# Patient Record
Sex: Male | Born: 1975 | Race: Black or African American | Hispanic: No | Marital: Married | Smoking: Never smoker
Health system: Southern US, Community
[De-identification: ages and names within clinical notes are randomized; demographics above are authoritative.]

## PROBLEM LIST (undated history)

## (undated) DIAGNOSIS — I10 Essential (primary) hypertension: Secondary | ICD-10-CM

## (undated) DIAGNOSIS — E7800 Pure hypercholesterolemia, unspecified: Secondary | ICD-10-CM

## (undated) DIAGNOSIS — E119 Type 2 diabetes mellitus without complications: Secondary | ICD-10-CM

## (undated) HISTORY — PX: TONSILLECTOMY: SUR1361

## (undated) HISTORY — PX: MYRINGOTOMY: SUR874

---

## 2000-07-12 ENCOUNTER — Emergency Department (HOSPITAL_COMMUNITY): Admission: EM | Admit: 2000-07-12 | Discharge: 2000-07-12 | Payer: Self-pay | Admitting: Emergency Medicine

## 2000-07-12 ENCOUNTER — Encounter: Payer: Self-pay | Admitting: Emergency Medicine

## 2000-08-04 ENCOUNTER — Encounter: Admission: RE | Admit: 2000-08-04 | Discharge: 2000-10-29 | Payer: Self-pay | Admitting: Family Medicine

## 2006-05-08 ENCOUNTER — Emergency Department (HOSPITAL_COMMUNITY): Admission: EM | Admit: 2006-05-08 | Discharge: 2006-05-08 | Payer: Self-pay | Admitting: Emergency Medicine

## 2013-04-19 ENCOUNTER — Ambulatory Visit
Admission: RE | Admit: 2013-04-19 | Discharge: 2013-04-19 | Disposition: A | Discharge status: Home / Self Care | Payer: BC Managed Care – PPO | Source: Ambulatory Visit | Attending: Family Medicine | Admitting: Family Medicine

## 2013-04-19 ENCOUNTER — Other Ambulatory Visit: Payer: Self-pay | Admitting: Family Medicine

## 2013-04-19 DIAGNOSIS — R52 Pain, unspecified: Secondary | ICD-10-CM

## 2013-05-04 ENCOUNTER — Other Ambulatory Visit: Payer: Self-pay | Admitting: Orthopedic Surgery

## 2013-05-04 ENCOUNTER — Ambulatory Visit
Admission: RE | Admit: 2013-05-04 | Discharge: 2013-05-04 | Disposition: A | Discharge status: Home / Self Care | Payer: BC Managed Care – PPO | Source: Ambulatory Visit | Attending: Orthopedic Surgery | Admitting: Orthopedic Surgery

## 2013-05-04 DIAGNOSIS — G5791 Unspecified mononeuropathy of right lower limb: Secondary | ICD-10-CM

## 2013-05-11 ENCOUNTER — Ambulatory Visit: Payer: BC Managed Care – PPO

## 2013-11-04 IMAGING — CR DG LUMBAR SPINE COMPLETE 4+V
5 series · 5 of 5 positions shown · non-contrast
Comparison: None.

CLINICAL DATA: Right thigh pain, no trauma

LUMBAR SPINE - COMPLETE 4+ VIEW

[view not recorded (1 of 5)]
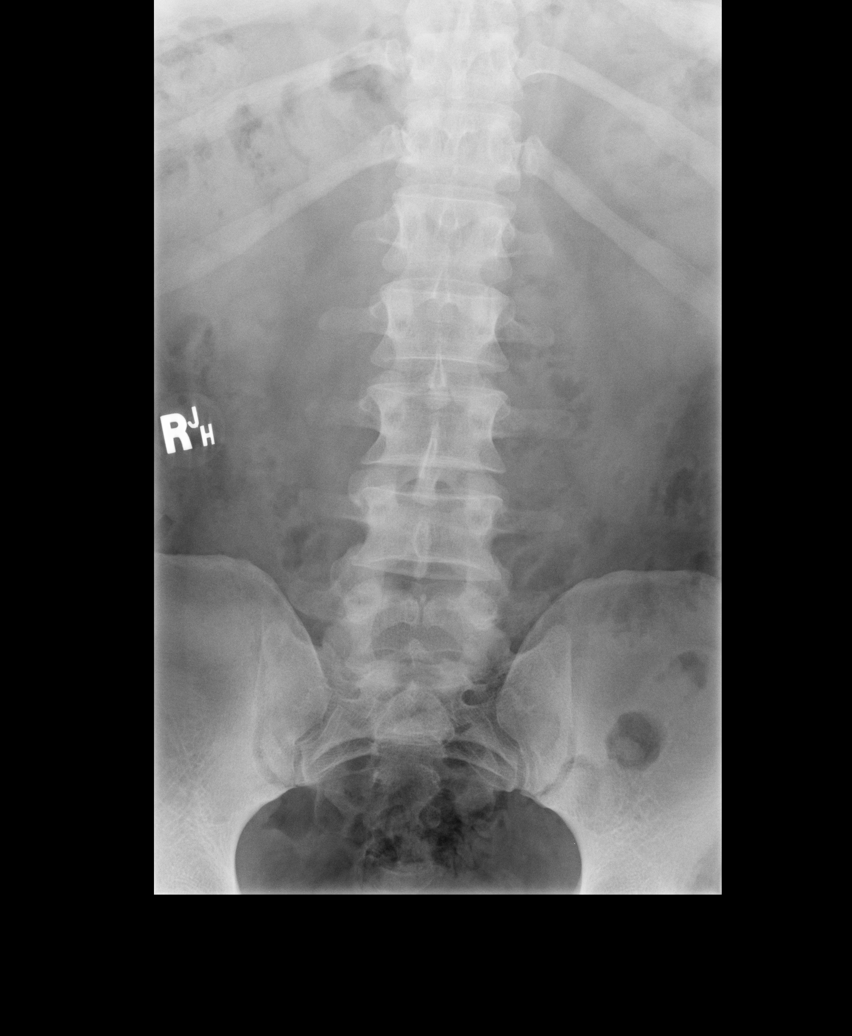

[view not recorded (2 of 5)]
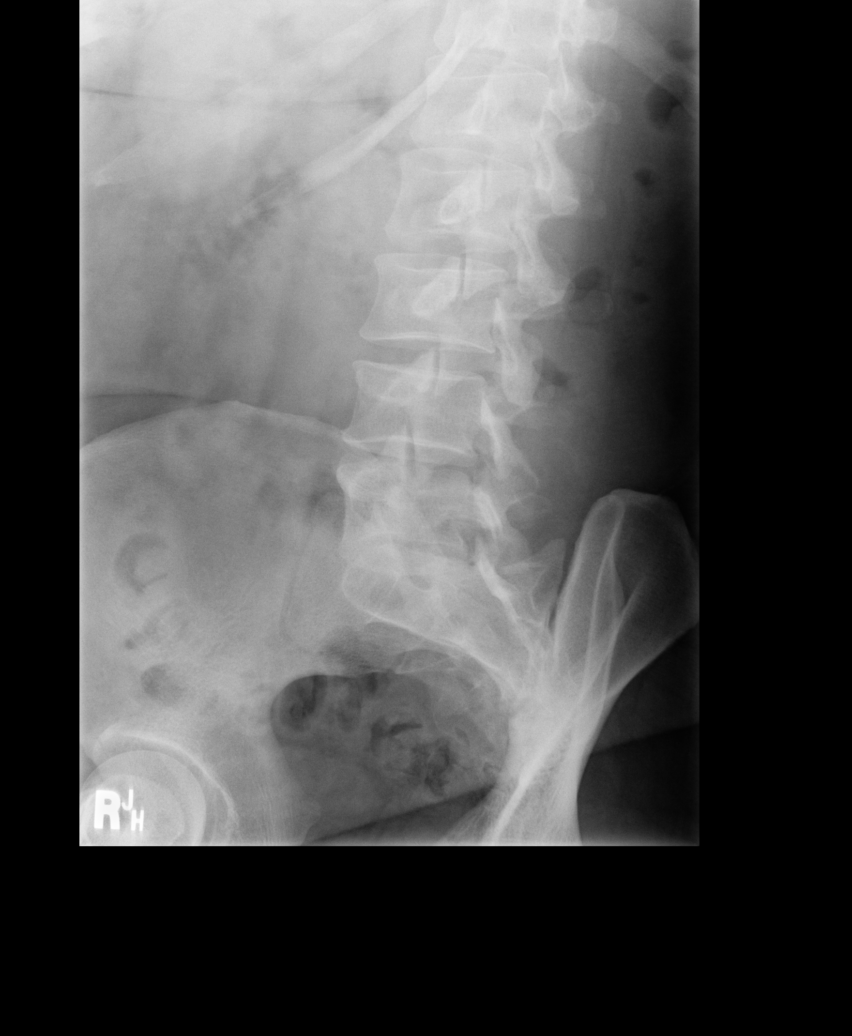

[view not recorded (3 of 5)]
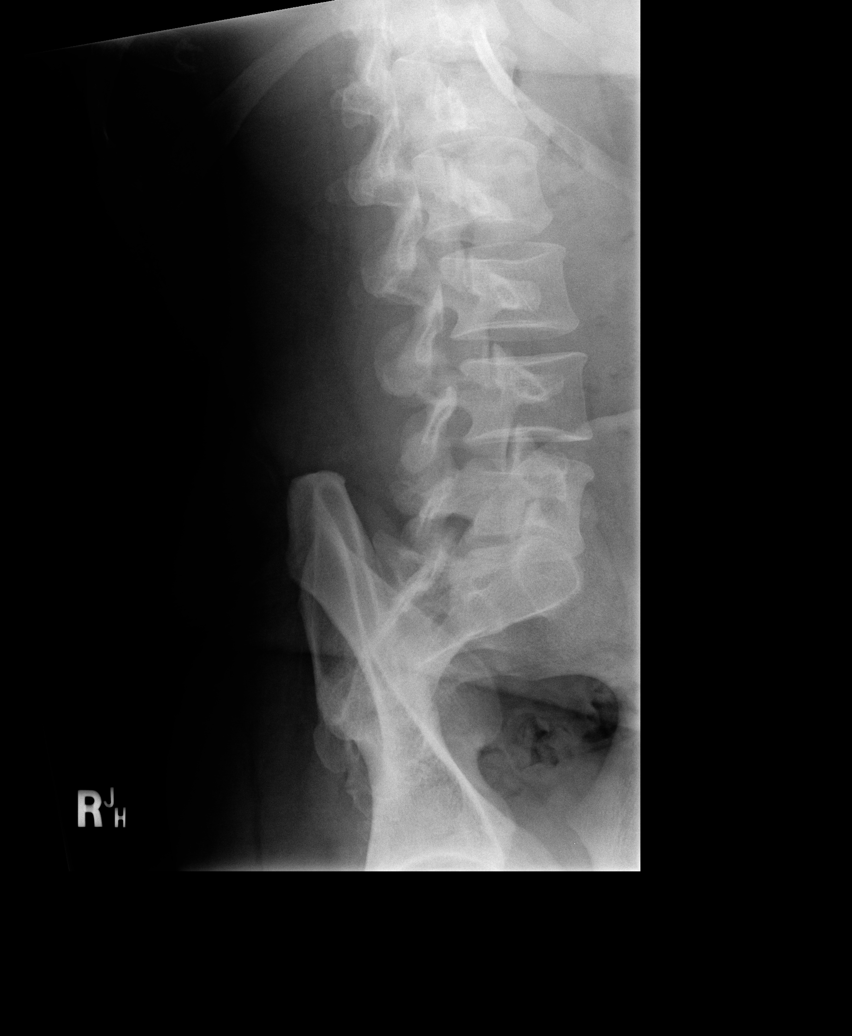

[view not recorded (4 of 5)]
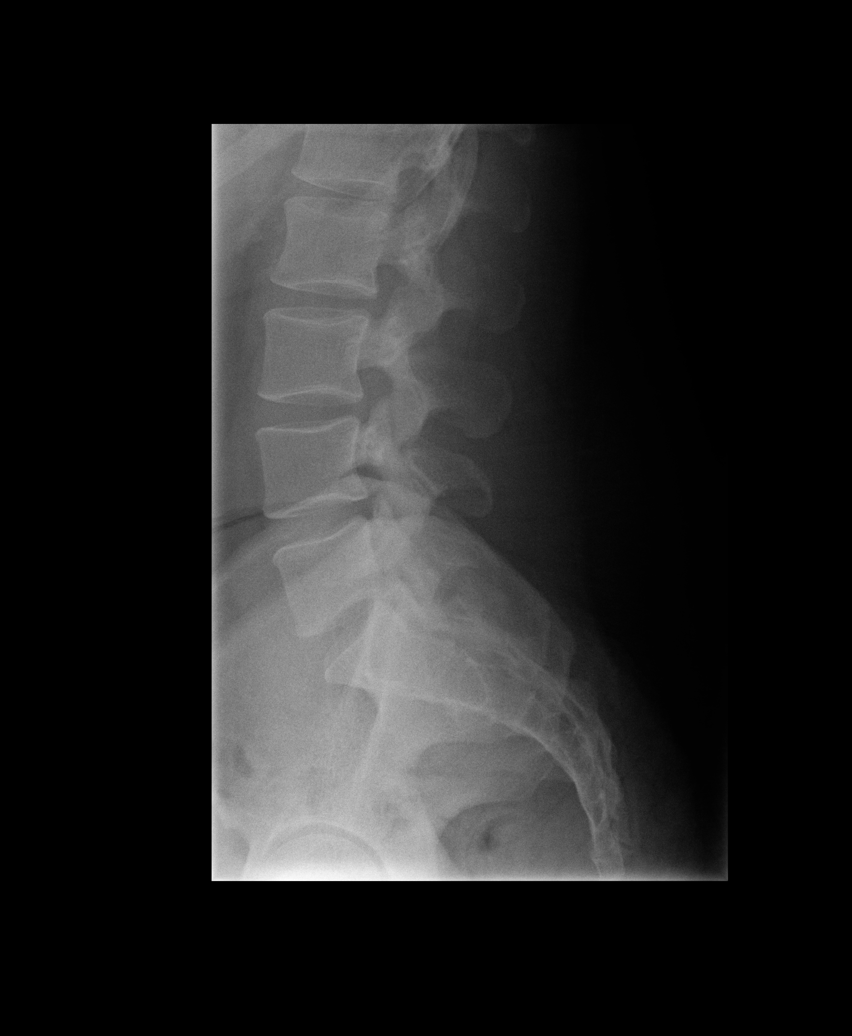

[view not recorded (5 of 5)]
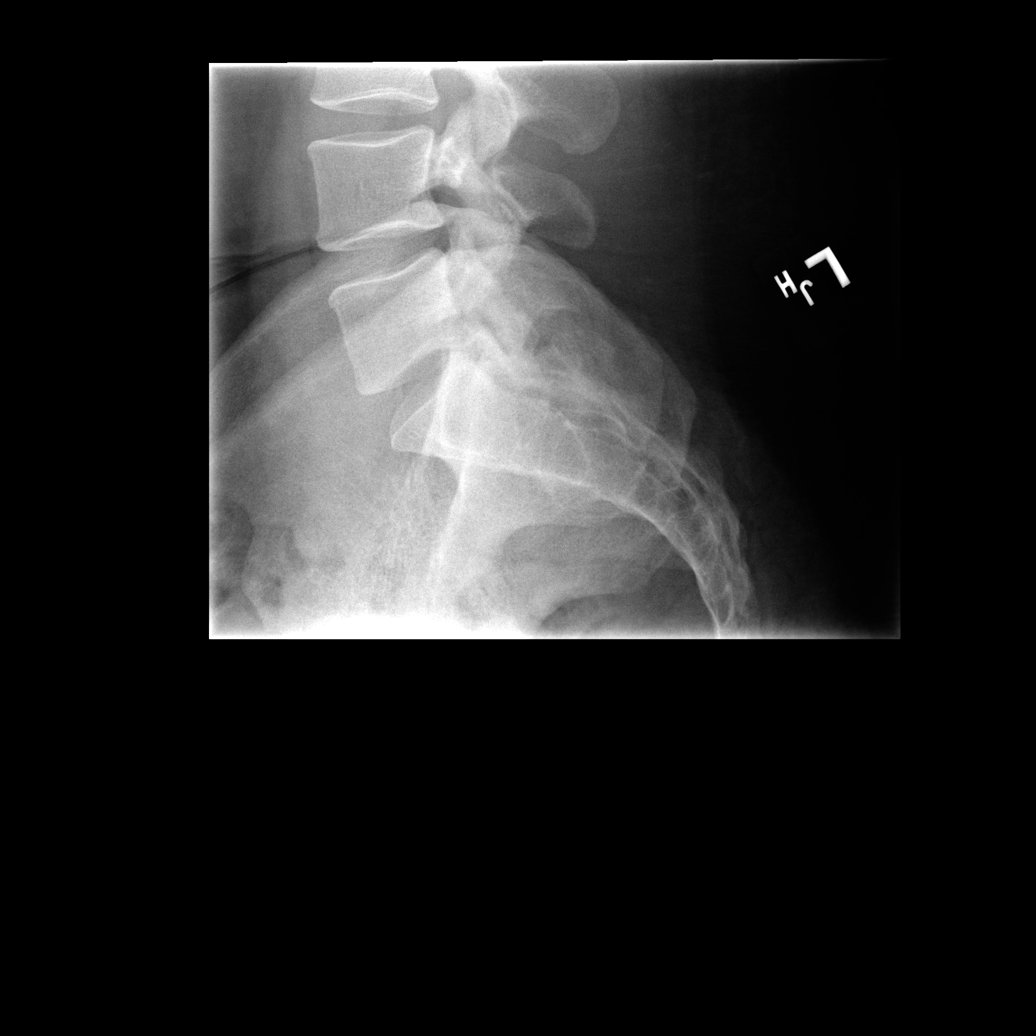

[5 of 5 positions shown; findings below may reference images not displayed]

FINDINGS: The lumbar vertebrae are in normal alignment.
Intervertebral disc spaces appear normal.  No acute compression
deformity is seen.  No pars defect is noted.  Spina bifida occulta
is present at L5.  The SI joints are corticated.
IMPRESSION: Normal alignment.  Normal intervertebral disc spaces.

## 2014-03-05 ENCOUNTER — Inpatient Hospital Stay (HOSPITAL_COMMUNITY)
Admission: EM | Admit: 2014-03-05 | Discharge: 2014-03-07 | DRG: 309 | Disposition: A | Discharge status: Home / Self Care | Payer: BC Managed Care – PPO | Attending: Internal Medicine | Admitting: Internal Medicine

## 2014-03-05 ENCOUNTER — Encounter (HOSPITAL_COMMUNITY): Payer: Self-pay | Admitting: Emergency Medicine

## 2014-03-05 ENCOUNTER — Emergency Department (HOSPITAL_COMMUNITY): Payer: BC Managed Care – PPO

## 2014-03-05 DIAGNOSIS — E785 Hyperlipidemia, unspecified: Secondary | ICD-10-CM | POA: Diagnosis present

## 2014-03-05 DIAGNOSIS — G4733 Obstructive sleep apnea (adult) (pediatric): Secondary | ICD-10-CM | POA: Diagnosis present

## 2014-03-05 DIAGNOSIS — I10 Essential (primary) hypertension: Secondary | ICD-10-CM | POA: Diagnosis present

## 2014-03-05 DIAGNOSIS — R079 Chest pain, unspecified: Secondary | ICD-10-CM

## 2014-03-05 DIAGNOSIS — Z6841 Body Mass Index (BMI) 40.0 and over, adult: Secondary | ICD-10-CM

## 2014-03-05 DIAGNOSIS — I4891 Unspecified atrial fibrillation: Principal | ICD-10-CM | POA: Diagnosis present

## 2014-03-05 DIAGNOSIS — E119 Type 2 diabetes mellitus without complications: Secondary | ICD-10-CM | POA: Diagnosis present

## 2014-03-05 HISTORY — DX: Type 2 diabetes mellitus without complications: E11.9

## 2014-03-05 HISTORY — DX: Essential (primary) hypertension: I10

## 2014-03-05 HISTORY — DX: Pure hypercholesterolemia, unspecified: E78.00

## 2014-03-05 LAB — URINALYSIS, ROUTINE W REFLEX MICROSCOPIC
BILIRUBIN URINE: NEGATIVE
Glucose, UA: NEGATIVE mg/dL
Hgb urine dipstick: NEGATIVE
Ketones, ur: NEGATIVE mg/dL
Leukocytes, UA: NEGATIVE
Nitrite: NEGATIVE
Protein, ur: NEGATIVE mg/dL
SPECIFIC GRAVITY, URINE: 1.028 (ref 1.005–1.030)
Urobilinogen, UA: 1 mg/dL (ref 0.0–1.0)
pH: 5.5 (ref 5.0–8.0)

## 2014-03-05 LAB — CBC WITH DIFFERENTIAL/PLATELET
Basophils Absolute: 0 10*3/uL (ref 0.0–0.1)
Basophils Relative: 0 % (ref 0–1)
EOS PCT: 2 % (ref 0–5)
Eosinophils Absolute: 0.3 10*3/uL (ref 0.0–0.7)
HCT: 49.3 % (ref 39.0–52.0)
HEMOGLOBIN: 17 g/dL (ref 13.0–17.0)
Lymphocytes Relative: 24 % (ref 12–46)
Lymphs Abs: 3.4 10*3/uL (ref 0.7–4.0)
MCH: 31.6 pg (ref 26.0–34.0)
MCHC: 34.5 g/dL (ref 30.0–36.0)
MCV: 91.6 fL (ref 78.0–100.0)
Monocytes Absolute: 1.2 10*3/uL — ABNORMAL HIGH (ref 0.1–1.0)
Monocytes Relative: 8 % (ref 3–12)
NEUTROS ABS: 9.3 10*3/uL — AB (ref 1.7–7.7)
NEUTROS PCT: 66 % (ref 43–77)
PLATELETS: 251 10*3/uL (ref 150–400)
RBC: 5.38 MIL/uL (ref 4.22–5.81)
RDW: 13.3 % (ref 11.5–15.5)
WBC: 14.3 10*3/uL — ABNORMAL HIGH (ref 4.0–10.5)

## 2014-03-05 LAB — LIPID PANEL
Cholesterol: 182 mg/dL (ref 0–200)
HDL: 40 mg/dL (ref >39)
LDL CALC: 125 mg/dL — AB (ref 0–99)
TRIGLYCERIDES: 84 mg/dL (ref <150)
Total CHOL/HDL Ratio: 4.6 RATIO
VLDL: 17 mg/dL (ref 0–40)

## 2014-03-05 LAB — COMPREHENSIVE METABOLIC PANEL
ALT: 24 U/L (ref 0–53)
AST: 25 U/L (ref 0–37)
Albumin: 3.8 g/dL (ref 3.5–5.2)
Alkaline Phosphatase: 65 U/L (ref 39–117)
BUN: 15 mg/dL (ref 6–23)
CO2: 25 meq/L (ref 19–32)
CREATININE: 1.01 mg/dL (ref 0.50–1.35)
Calcium: 9.5 mg/dL (ref 8.4–10.5)
Chloride: 100 mEq/L (ref 96–112)
GFR calc non Af Amer: >90 mL/min (ref >90)
GLUCOSE: 258 mg/dL — AB (ref 70–99)
Potassium: 5.1 mEq/L (ref 3.7–5.3)
SODIUM: 140 meq/L (ref 137–147)
Total Bilirubin: 0.3 mg/dL (ref 0.3–1.2)
Total Protein: 8 g/dL (ref 6.0–8.3)

## 2014-03-05 LAB — PROTIME-INR
INR: 1.03 (ref 0.00–1.49)
PROTHROMBIN TIME: 13.3 s (ref 11.6–15.2)

## 2014-03-05 LAB — GLUCOSE, CAPILLARY
GLUCOSE-CAPILLARY: 127 mg/dL — AB (ref 70–99)
Glucose-Capillary: 164 mg/dL — ABNORMAL HIGH (ref 70–99)
Glucose-Capillary: 144 mg/dL — ABNORMAL HIGH (ref 70–99)

## 2014-03-05 LAB — HEPARIN LEVEL (UNFRACTIONATED): Heparin Unfractionated: 0.23 IU/mL — ABNORMAL LOW (ref 0.30–0.70)

## 2014-03-05 LAB — APTT: aPTT: 28 s (ref 24–37)

## 2014-03-05 LAB — HEMOGLOBIN A1C
Hgb A1c MFr Bld: 7.2 % — ABNORMAL HIGH (ref <5.7)
Mean Plasma Glucose: 160 mg/dL — ABNORMAL HIGH (ref <117)

## 2014-03-05 LAB — TROPONIN I
Troponin I: <0.3 ng/mL (ref <0.30)
Troponin I: <0.3 ng/mL (ref <0.30)
Troponin I: <0.3 ng/mL (ref <0.30)

## 2014-03-05 LAB — MRSA PCR SCREENING: MRSA BY PCR: NEGATIVE

## 2014-03-05 LAB — TSH: TSH: 3.09 u[IU]/mL (ref 0.350–4.500)

## 2014-03-05 MED ORDER — DILTIAZEM HCL 100 MG IV SOLR
5.0000 mg/h | INTRAVENOUS | Status: DC
  Administered 2014-03-05 – 2014-03-06 (×2): 5 mg/h via INTRAVENOUS

## 2014-03-05 MED ORDER — INSULIN ASPART 100 UNIT/ML ~~LOC~~ SOLN
0.0000 [IU] | Freq: Three times a day (TID) | SUBCUTANEOUS | Status: DC
  Administered 2014-03-05: 3 [IU] via SUBCUTANEOUS
  Administered 2014-03-05 – 2014-03-06 (×4): 2 [IU] via SUBCUTANEOUS
  Administered 2014-03-07: 3 [IU] via SUBCUTANEOUS
  Administered 2014-03-07: 2 [IU] via SUBCUTANEOUS

## 2014-03-05 MED ORDER — AMIODARONE HCL IN DEXTROSE 360-4.14 MG/200ML-% IV SOLN: 30.0000 mg/h | INTRAVENOUS | Status: DC

## 2014-03-05 MED ORDER — AMIODARONE LOAD VIA INFUSION
150.0000 mg | Freq: Once | INTRAVENOUS | Status: DC
  Filled 2014-03-05: qty 83.34

## 2014-03-05 MED ORDER — AMIODARONE HCL 200 MG PO TABS
200.0000 mg | ORAL_TABLET | Freq: Two times a day (BID) | ORAL | Status: DC
  Administered 2014-03-05 – 2014-03-07 (×5): 200 mg via ORAL
  Filled 2014-03-05 (×7): qty 1

## 2014-03-05 MED ORDER — SODIUM CHLORIDE 0.9 % IJ SOLN
3.0000 mL | Freq: Two times a day (BID) | INTRAMUSCULAR | Status: DC
  Administered 2014-03-05 – 2014-03-07 (×3): 3 mL via INTRAVENOUS

## 2014-03-05 MED ORDER — LISINOPRIL 20 MG PO TABS
20.0000 mg | ORAL_TABLET | Freq: Every day | ORAL | Status: DC
Start: 2014-03-05 — End: 2014-03-07
  Administered 2014-03-05 – 2014-03-07 (×3): 20 mg via ORAL
  Filled 2014-03-05 (×3): qty 1

## 2014-03-05 MED ORDER — HEPARIN SODIUM (PORCINE) 5000 UNIT/ML IJ SOLN
5000.0000 [IU] | Freq: Three times a day (TID) | INTRAMUSCULAR | Status: DC
  Administered 2014-03-05: 5000 [IU] via SUBCUTANEOUS
  Filled 2014-03-05 (×4): qty 1

## 2014-03-05 MED ORDER — ASPIRIN 325 MG PO TABS
325.0000 mg | ORAL_TABLET | Freq: Every day | ORAL | Status: DC
  Administered 2014-03-06: 325 mg via ORAL
  Filled 2014-03-05: qty 1

## 2014-03-05 MED ORDER — HEPARIN (PORCINE) IN NACL 100-0.45 UNIT/ML-% IJ SOLN
2500.0000 [IU]/h | INTRAMUSCULAR | Status: DC
  Administered 2014-03-05 – 2014-03-06 (×2): 2500 [IU]/h via INTRAVENOUS
  Filled 2014-03-05 (×3): qty 250

## 2014-03-05 MED ORDER — HEPARIN (PORCINE) IN NACL 100-0.45 UNIT/ML-% IJ SOLN
2000.0000 [IU]/h | INTRAMUSCULAR | Status: DC
  Administered 2014-03-05: 2000 [IU]/h via INTRAVENOUS
  Filled 2014-03-05 (×2): qty 250

## 2014-03-05 MED ORDER — DILTIAZEM HCL 100 MG IV SOLR
5.0000 mg/h | Freq: Once | INTRAVENOUS | Status: AC
  Administered 2014-03-05: 5 mg/h via INTRAVENOUS

## 2014-03-05 MED ORDER — HEPARIN BOLUS VIA INFUSION
2000.0000 [IU] | Freq: Once | INTRAVENOUS | Status: AC
  Administered 2014-03-05: 2000 [IU] via INTRAVENOUS
  Filled 2014-03-05: qty 2000

## 2014-03-05 MED ORDER — ACETAMINOPHEN 500 MG PO TABS: 1000.0000 mg | ORAL_TABLET | Freq: Four times a day (QID) | ORAL | Status: DC | PRN

## 2014-03-05 MED ORDER — AMIODARONE HCL IN DEXTROSE 360-4.14 MG/200ML-% IV SOLN: 60.0000 mg/h | INTRAVENOUS | Status: DC

## 2014-03-05 MED ORDER — METOPROLOL TARTRATE 1 MG/ML IV SOLN
INTRAVENOUS | Status: AC
  Filled 2014-03-05: qty 5

## 2014-03-05 MED ORDER — ASPIRIN 81 MG PO CHEW
324.0000 mg | CHEWABLE_TABLET | Freq: Once | ORAL | Status: AC
  Administered 2014-03-05: 324 mg via ORAL
  Filled 2014-03-05: qty 4

## 2014-03-05 NOTE — Progress Notes (Signed)
ANTICOAGULATION CONSULT NOTE - Initial Consult  Pharmacy Consult for Heparin Indication: atrial fibrillation  No Known Allergies  Patient Measurements: Height: 5\' 9"  (175.3 cm) Weight: 323 lb 10.2 oz (146.8 kg) IBW/kg (Calculated) : 70.7 Heparin Dosing Weight: 106kg  Vital Signs: Temp: 98.3 F (36.8 C) (05/30 1200) Temp src: Oral (05/30 1200) BP: 143/107 mmHg (05/30 1100) Pulse Rate: 92 (05/30 1100)  Labs:  Recent Labs  03/05/14 0210  HGB 17.0  HCT 49.3  PLT 251  CREATININE 1.01  TROPONINI <0.30    Estimated Creatinine Clearance: 139 ml/min (by C-G formula based on Cr of 1.01).   Medical History: Past Medical History  Diagnosis Date  . Hypertension   . Diabetes mellitus without complication   . Elevated cholesterol     Medications:  Prescriptions prior to admission  Medication Sig Dispense Refill  . acetaminophen (TYLENOL) 500 MG tablet Take 2,000 mg by mouth 2 (two) times daily.      . naphazoline-pheniramine (NAPHCON-A) 0.025-0.3 % ophthalmic solution Place 1 drop into both eyes as needed for irritation.       Scheduled:  . amiodarone  150 mg Intravenous Once  . [START ON 03/06/2014] aspirin  325 mg Oral Daily  . insulin aspart  0-15 Units Subcutaneous TID WC  . lisinopril  20 mg Oral Daily  . metoprolol      . sodium chloride  3 mL Intravenous Q12H    Assessment: 38yo M admitted with palpitations, pleuritic chest pain, SOB, and feeling tired. Found to be in A.fib with RVR. Started on a diltiazem drip and amiodarone drip. Pharmacy is asked to start heparin infusion. Heparin 5000units SQ was given at 05:42.  CBC is wnl.   No baseline coags.   Goal of Therapy:  Heparin level 0.3-0.7 units/ml Monitor platelets by anticoagulation protocol: Yes   Plan:   Draw baseline aPTT and PT/INR.  Give heparin 2000units IV bolus(conservative d/t SQ dose given earlier) then start infusion at 2000units/hr.  Check heparin level in 6hrs and daily  therafter.  Check CBC q48h while on heparin.  Charolotte Eke, PharmD, pager [phone]. 03/05/2014,1:13 PM.  F/u daily.

## 2014-03-05 NOTE — ED Notes (Signed)
No change in Cardizem at this time, HR 90-110. Pt denies pain at this time.

## 2014-03-05 NOTE — Progress Notes (Signed)
Patient seen earlier today by my colleague Dr. Julian Reil. Patient seen and examined, and data base reviewed. Patient admitted to the hospital because of atrial fibrillation, started on Cardizem drip. Cardiology to evaluate.  Clint Lipps Pager: [phone] 03/05/2014, 11:15 AM

## 2014-03-05 NOTE — ED Notes (Signed)
Pt states he developed mid-sternal chest pressure yesterday afternoon at 1600 associated with SOB and diaphoresis-states he feels like "my heart is in my throat"

## 2014-03-05 NOTE — Consult Note (Signed)
Reason for Consult: A. fib with RVR Referring Physician: [facility]  Paul Ford is an 38 y.o. male.  HPI: Patient is 38 year old male with past medical history significant for hypertension, diabetes mellitus, hypercholesteremia, morbid obesity, was admitted yesterday because of palpitations associated with pleuritic chest pain and mild shortness of breath off and on since yesterday evening associated with feeling tired and was noted to be in A. fib with RVR. Patient denies any anginal chest pain nausea vomiting diaphoresis. Denies pain the orthopnea leg swelling. States has not taken any medication for more than a year. Denies any thyroid problems in the past. Denies any palpitations in the past. He was started on IV Cardizem drip with control of heart rate in 100-110.   Past Medical History  Diagnosis Date  . Hypertension   . Diabetes mellitus without complication   . Elevated cholesterol     Past Surgical History  Procedure Laterality Date  . Tonsillectomy    . Myringotomy      History reviewed. No pertinent family history.  Social History:  reports that he has never smoked. He has never used smokeless tobacco. He reports that he drinks alcohol. He reports that he does not use illicit drugs.  Allergies: No Known Allergies  Medications: I have reviewed the patient's current medications.  Results for orders placed during the hospital encounter of 03/05/14 (from the past 48 hour(s))  CBC WITH DIFFERENTIAL     Status: Abnormal   Collection Time    03/05/14  2:10 AM      Result Value Ref Range   WBC 14.3 (*) 4.0 - 10.5 K/uL   RBC 5.38  4.22 - 5.81 MIL/uL   Hemoglobin 17.0  13.0 - 17.0 g/dL   HCT 49.3  39.0 - 52.0 %   MCV 91.6  78.0 - 100.0 fL   MCH 31.6  26.0 - 34.0 pg   MCHC 34.5  30.0 - 36.0 g/dL   RDW 13.3  11.5 - 15.5 %   Platelets 251  150 - 400 K/uL   Neutrophils Relative % 66  43 - 77 %   Neutro Abs 9.3 (*) 1.7 - 7.7 K/uL   Lymphocytes Relative 24  12 - 46 %   Lymphs Abs 3.4  0.7 - 4.0 K/uL   Monocytes Relative 8  3 - 12 %   Monocytes Absolute 1.2 (*) 0.1 - 1.0 K/uL   Eosinophils Relative 2  0 - 5 %   Eosinophils Absolute 0.3  0.0 - 0.7 K/uL   Basophils Relative 0  0 - 1 %   Basophils Absolute 0.0  0.0 - 0.1 K/uL  COMPREHENSIVE METABOLIC PANEL     Status: Abnormal   Collection Time    03/05/14  2:10 AM      Result Value Ref Range   Sodium 140  137 - 147 mEq/L   Potassium 5.1  3.7 - 5.3 mEq/L   Comment: SLIGHT HEMOLYSIS     HEMOLYSIS AT THIS LEVEL MAY AFFECT RESULT   Chloride 100  96 - 112 mEq/L   CO2 25  19 - 32 mEq/L   Glucose, Bld 258 (*) 70 - 99 mg/dL   BUN 15  6 - 23 mg/dL   Creatinine, Ser 1.01  0.50 - 1.35 mg/dL   Calcium 9.5  8.4 - 10.5 mg/dL   Total Protein 8.0  6.0 - 8.3 g/dL   Albumin 3.8  3.5 - 5.2 g/dL   AST 25  0 - 37 U/L  Comment: SLIGHT HEMOLYSIS     HEMOLYSIS AT THIS LEVEL MAY AFFECT RESULT   ALT 24  0 - 53 U/L   Comment: SLIGHT HEMOLYSIS     HEMOLYSIS AT THIS LEVEL MAY AFFECT RESULT   Alkaline Phosphatase 65  39 - 117 U/L   Total Bilirubin 0.3  0.3 - 1.2 mg/dL   GFR calc non Af Amer >90  >90 mL/min   GFR calc Af Amer >90  >90 mL/min   Comment: (NOTE)     The eGFR has been calculated using the CKD EPI equation.     This calculation has not been validated in all clinical situations.     eGFR's persistently <90 mL/min signify possible Chronic Kidney     Disease.  TROPONIN I     Status: None   Collection Time    03/05/14  2:10 AM      Result Value Ref Range   Troponin I <0.30  <0.30 ng/mL   Comment:            Due to the release kinetics of cTnI,     a negative result within the first hours     of the onset of symptoms does not rule out     myocardial infarction with certainty.     If myocardial infarction is still suspected,     repeat the test at appropriate intervals.  MRSA PCR SCREENING     Status: None   Collection Time    03/05/14  5:18 AM      Result Value Ref Range   MRSA by PCR NEGATIVE  NEGATIVE    Comment:            The GeneXpert MRSA Assay (FDA     approved for NASAL specimens     only), is one component of a     comprehensive MRSA colonization     surveillance program. It is not     intended to diagnose MRSA     infection nor to guide or     monitor treatment for     MRSA infections.  URINALYSIS, ROUTINE W REFLEX MICROSCOPIC     Status: None   Collection Time    03/05/14  5:28 AM      Result Value Ref Range   Color, Urine YELLOW  YELLOW   APPearance CLEAR  CLEAR   Specific Gravity, Urine 1.028  1.005 - 1.030   pH 5.5  5.0 - 8.0   Glucose, UA NEGATIVE  NEGATIVE mg/dL   Hgb urine dipstick NEGATIVE  NEGATIVE   Bilirubin Urine NEGATIVE  NEGATIVE   Ketones, ur NEGATIVE  NEGATIVE mg/dL   Protein, ur NEGATIVE  NEGATIVE mg/dL   Urobilinogen, UA 1.0  0.0 - 1.0 mg/dL   Nitrite NEGATIVE  NEGATIVE   Leukocytes, UA NEGATIVE  NEGATIVE   Comment: MICROSCOPIC NOT DONE ON URINES WITH NEGATIVE PROTEIN, BLOOD, LEUKOCYTES, NITRITE, OR GLUCOSE <1000 mg/dL.    Dg Chest Port 1 View  03/05/2014   CLINICAL DATA:  38 year old male chest pressure shortness of Breath. Initial encounter.  EXAM: PORTABLE CHEST - 1 VIEW  COMPARISON:  None.  FINDINGS: Portable AP semi upright view at 0232 hrs. Normal cardiac size and mediastinal contours. Visualized tracheal air column is within normal limits. Allowing for portable technique, the lungs are clear. No pneumothorax or pleural effusion.  IMPRESSION: No acute cardiopulmonary abnormality.   Electronically Signed   By: Lars Pinks M.D.   On: 03/05/2014 03:02  Review of Systems  Constitutional: Negative for fever and chills.  HENT: Negative for hearing loss.   Eyes: Negative for double vision, photophobia and pain.  Respiratory: Positive for shortness of breath. Negative for cough and hemoptysis.   Cardiovascular: Positive for chest pain and palpitations. Negative for orthopnea, claudication and leg swelling.  Gastrointestinal: Negative for nausea,  vomiting, abdominal pain and diarrhea.  Genitourinary: Negative for dysuria.  Skin: Negative for rash.  Neurological: Negative for dizziness and headaches.   Blood pressure 143/107, pulse 92, temperature 98.3 F (36.8 C), temperature source Oral, resp. rate 18, height 5' 9" (1.753 m), weight 146.8 kg (323 lb 10.2 oz), SpO2 97.00%. Physical Exam  Constitutional: He is oriented to person, place, and time.  HENT:  Head: Normocephalic and atraumatic.  Eyes: Conjunctivae are normal. Pupils are equal, round, and reactive to light. Left eye exhibits no discharge.  Neck: Normal range of motion. Neck supple. No JVD present. No tracheal deviation present. No thyromegaly present.  Cardiovascular:  Irregularly irregular S1 and S2 soft tachycardic  Respiratory: Effort normal and breath sounds normal. No respiratory distress. He has no wheezes. He has no rales.  GI: Soft. Bowel sounds are normal. He exhibits distension. There is no tenderness. There is no rebound.  Musculoskeletal: He exhibits no edema and no tenderness.  Neurological: He is alert and oriented to person, place, and time.    Assessment/Plan: A. fib with RVR Status post atypical chest pain Uncontrolled hypertension Diabetes mellitus Morbid obesity Hypercholesteremia Probable obstructive sleep apnea Plan Check serial enzymes and EKG Switch subcutaneous heparin to IV heparin as per orders Start IV amiodarone as per orders Add ACE inhibitors as per orders Need sleep study as outpatient Nutrition consult Check 2-D echo and TSH   Mohan N Harwani 03/05/2014, 12:30 PM

## 2014-03-05 NOTE — ED Notes (Signed)
X-ray at bedside

## 2014-03-05 NOTE — H&P (Signed)
[facility] History and Physical  Paul SailorsDean Ford ZOX:[id number] DOB: 09-03-1976 DOA: 03/05/2014  Referring physician: EDP PCP: No primary provider on file.   Chief Complaint: Chest pain   HPI: Paul SailorsDean Ford is a 38 y.o. male who presents to the ED with c/o palpitations.  He states it feels "like my heart is in my throat".  He also has SOB and L sided chest pressure with this.  All of this onset around 4pm yesterday.  No cough, fever, chills, extremity swelling or pain.  He has been recently diagnosed with "borderline" diabetes, HTN, and hyperlipidemia.  No recent medication changes, and currently on no meds for any of these at home.  In ED, found to be in A.Fib RVR (new onset and diagnosis), with initial rate in the 170s!  On my evaluation patient reports he feels much better with resolved symptoms after treatment with cardizem gtt and getting his rate down to the 100s-110s.  Review of Systems: Systems reviewed.  As above, otherwise negative  Past Medical History  Diagnosis Date  . Hypertension   . Diabetes mellitus without complication   . Elevated cholesterol    Past Surgical History  Procedure Laterality Date  . Tonsillectomy    . Myringotomy     Social History:  reports that he has never smoked. He has never used smokeless tobacco. He reports that he drinks alcohol. He reports that he does not use illicit drugs.  No Known Allergies  No family history on file.   Prior to Admission medications   Medication Sig Start Date End Date Taking? Authorizing Provider  acetaminophen (TYLENOL) 500 MG tablet Take 2,000 mg by mouth 2 (two) times daily.   Yes Historical Provider, MD  naphazoline-pheniramine (NAPHCON-A) 0.025-0.3 % ophthalmic solution Place 1 drop into both eyes as needed for irritation.   Yes Historical Provider, MD   Physical Exam: Filed Vitals:   03/05/14 0315  BP: 127/51  Pulse: 72  Resp: 17    BP 127/51  Pulse 72  Resp 17  SpO2 98%  General Appearance:     Alert, oriented, no distress, appears stated age  Head:    Normocephalic, atraumatic  Eyes:    PERRL, EOMI, sclera non-icteric        Nose:   Nares without drainage or epistaxis. Mucosa, turbinates normal  Throat:   Moist mucous membranes. Oropharynx without erythema or exudate.  Neck:   Supple. No carotid bruits.  No thyromegaly.  No lymphadenopathy.   Back:     No CVA tenderness, no spinal tenderness  Lungs:     Clear to auscultation bilaterally, without wheezes, rhonchi or rales  Chest wall:    No tenderness to palpitation  Heart:    irr irr without murmurs, gallops, rubs  Abdomen:     Soft, non-tender, nondistended, normal bowel sounds, no organomegaly  Genitalia:    deferred  Rectal:    deferred  Extremities:   No clubbing, cyanosis or edema.  Pulses:   2+ and symmetric all extremities  Skin:   Skin color, texture, turgor normal, no rashes or lesions  Lymph nodes:   Cervical, supraclavicular, and axillary nodes normal  Neurologic:   CNII-XII intact. Normal strength, sensation and reflexes      throughout    Labs on Admission:  Basic Metabolic Panel:  Recent Labs Lab 03/05/14 0210  NA 140  K 5.1  CL 100  CO2 25  GLUCOSE 258*  BUN 15  CREATININE 1.01  CALCIUM 9.5  Liver Function Tests:  Recent Labs Lab 03/05/14 0210  AST 25  ALT 24  ALKPHOS 65  BILITOT 0.3  PROT 8.0  ALBUMIN 3.8   No results found for this basename: LIPASE, AMYLASE,  in the last 168 hours No results found for this basename: AMMONIA,  in the last 168 hours CBC:  Recent Labs Lab 03/05/14 0210  WBC 14.3*  NEUTROABS 9.3*  HGB 17.0  HCT 49.3  MCV 91.6  PLT 251   Cardiac Enzymes:  Recent Labs Lab 03/05/14 0210  TROPONINI <0.30    BNP (last 3 results) No results found for this basename: PROBNP,  in the last 8760 hours CBG: No results found for this basename: GLUCAP,  in the last 168 hours  Radiological Exams on Admission: Dg Chest Port 1 View  03/05/2014   CLINICAL DATA:   38 year old male chest pressure shortness of Breath. Initial encounter.  EXAM: PORTABLE CHEST - 1 VIEW  COMPARISON:  None.  FINDINGS: Portable AP semi upright view at 0232 hrs. Normal cardiac size and mediastinal contours. Visualized tracheal air column is within normal limits. Allowing for portable technique, the lungs are clear. No pneumothorax or pleural effusion.  IMPRESSION: No acute cardiopulmonary abnormality.   Electronically Signed   By: Augusto Gamble M.D.   On: 03/05/2014 03:02    EKG: Independently reviewed.  Assessment/Plan Active Problems:   New onset atrial fibrillation   Atrial fibrillation with RVR   DM2 (diabetes mellitus, type 2)   1. New onset A.Fib with RVR - rate control on cardizem gtt per Dr. Santiago Glad recs.  Will admit to SDU and continue this.  Suspect that cards will ultimately try for rhythm control and ablation in this young patient.  Will defer decision to start full anticoagulation to cards, ASA 325 daily for now. 2. DM2 - putting patient on med dose SSI AC/HS 3. HTN - on no home meds, BP controlled for now, continue to monitor.    Code Status: Full Code  Family Communication: No family in room Disposition Plan: Admit to inpatient   Time spent: 50 min  Hillary Bow [facility] Pager [phone]  If 7AM-7PM, please contact the day team taking care of the patient [email or web address] Password [email or web address] 03/05/2014, 4:18 AM

## 2014-03-05 NOTE — Progress Notes (Signed)
At approximately 1230, pt converted from afib to NSR. Cardiologist, Dr. Sharyn Lull, made aware and ordered EKG. EKG obtained at 1255 confirming conversion to NSR. Will continue to monitor.  Langley Gauss, RN/BSN

## 2014-03-05 NOTE — ED Notes (Signed)
Pt reports mid-upper chest discomfort which started yesterday afternoon.  Pt reports SOB with the discomfort.

## 2014-03-05 NOTE — Progress Notes (Signed)
ANTICOAGULATION CONSULT NOTE - Follow Up  Pharmacy Consult for Heparin Indication: atrial fibrillation  No Known Allergies  Patient Measurements: Height: 5\' 9"  (175.3 cm) Weight: 323 lb 10.2 oz (146.8 kg) IBW/kg (Calculated) : 70.7 Heparin Dosing Weight: 106kg  Vital Signs: Temp: 98.3 F (36.8 C) (05/30 1600) Temp src: Oral (05/30 1600) BP: 159/100 mmHg (05/30 1649) Pulse Rate: 73 (05/30 1649)  Labs:  Recent Labs  03/05/14 0210 03/05/14 1250 03/05/14 1358 03/05/14 1954 03/05/14 2025  HGB 17.0  --   --   --   --   HCT 49.3  --   --   --   --   PLT 251  --   --   --   --   APTT  --   --  28  --   --   LABPROT  --   --  13.3  --   --   INR  --   --  1.03  --   --   HEPARINUNFRC  --   --   --   --  0.23*  CREATININE 1.01  --   --   --   --   TROPONINI <0.30 <0.30  --  <0.30  --     Estimated Creatinine Clearance: 139 ml/min (by C-G formula based on Cr of 1.01).   Medications:  Prescriptions prior to admission  Medication Sig Dispense Refill  . acetaminophen (TYLENOL) 500 MG tablet Take 2,000 mg by mouth 2 (two) times daily.      . naphazoline-pheniramine (NAPHCON-A) 0.025-0.3 % ophthalmic solution Place 1 drop into both eyes as needed for irritation.       Scheduled:  . amiodarone  200 mg Oral BID  . [START ON 03/06/2014] aspirin  325 mg Oral Daily  . insulin aspart  0-15 Units Subcutaneous TID WC  . lisinopril  20 mg Oral Daily  . metoprolol      . sodium chloride  3 mL Intravenous Q12H    Assessment: 38yo M admitted with palpitations, pleuritic chest pain, SOB, and feeling tired. Found to be in A.fib with RVR. Started on a diltiazem drip and amiodarone drip. Pharmacy is asked to start heparin infusion.   CBC is wnl, no bleeding reported  Baseline coags wnl  First heparin level subtherapeutic after 2000 units IV bolus and 2000 units/hr infusion  Goal of Therapy:  Heparin level 0.3-0.7 units/ml Monitor platelets by anticoagulation protocol: Yes    Plan:   Increase heparin 2500 units/hr  Check heparin level with AM labs 5/31  Daily heparin level and CBC  Loralee Pacas, PharmD, BCPS Pager: [phone] 03/05/2014,8:58 PM.

## 2014-03-05 NOTE — ED Provider Notes (Signed)
CSN: [id number]     Arrival date & time 03/05/14  0148 History   First MD Initiated Contact with Patient 03/05/14 0209     Chief Complaint  Patient presents with  . Chest Pain     (Consider location/radiation/quality/duration/timing/severity/associated sxs/prior Treatment) HPI Presents with irregular palpitations, shortness of breath and left-sided chest pressure starting around 4 PM yesterday. The pressure radiates up into his throat. He denies any cough, fever or chills. He's had no lower extremity swelling or pain. Patient states he has been recently diagnosed with borderline diabetes, hypertension and hyperlipidemia. He denies any smoking history. No recent extended travel or surgeries. No past medical history on file. No past surgical history on file. No family history on file. History  Substance Use Topics  . Smoking status: Not on file  . Smokeless tobacco: Not on file  . Alcohol Use: Not on file    Review of Systems  Constitutional: Negative for fever, chills and diaphoresis.  Respiratory: Positive for shortness of breath. Negative for cough and wheezing.   Cardiovascular: Positive for chest pain and palpitations. Negative for leg swelling.  Gastrointestinal: Negative for nausea, vomiting, abdominal pain and diarrhea.  Musculoskeletal: Negative for back pain, neck pain and neck stiffness.  Skin: Negative for rash and wound.  Neurological: Negative for dizziness, syncope, weakness, numbness and headaches.  All other systems reviewed and are negative.     Allergies  Review of patient's allergies indicates not on file.  Home Medications   Prior to Admission medications   Not on File   There were no vitals taken for this visit. Physical Exam  Nursing note and vitals reviewed. Constitutional: He is oriented to person, place, and time. He appears well-developed and well-nourished. No distress.  HENT:  Head: Normocephalic and atraumatic.  Mouth/Throat: Oropharynx is  clear and moist.  Eyes: EOM are normal. Pupils are equal, round, and reactive to light.  Neck: Normal range of motion. Neck supple.  Cardiovascular: Exam reveals no gallop and no friction rub.   No murmur heard. Regularly irregular tachycardia  Pulmonary/Chest: Effort normal and breath sounds normal. No respiratory distress. He has no wheezes. He has no rales. He exhibits no tenderness.  Abdominal: Soft. Bowel sounds are normal. He exhibits no distension and no mass. There is no tenderness. There is no rebound and no guarding.  Musculoskeletal: Normal range of motion. He exhibits no edema and no tenderness.  No calf swelling or tenderness.  Neurological: He is alert and oriented to person, place, and time.  Skin: Skin is warm and dry. No rash noted. No erythema.  Psychiatric: He has a normal mood and affect. His behavior is normal.    ED Course  Procedures (including critical care time) Labs Review Labs Reviewed  CBC WITH DIFFERENTIAL  COMPREHENSIVE METABOLIC PANEL  URINALYSIS, ROUTINE W REFLEX MICROSCOPIC  TROPONIN I    Imaging Review No results found.   EKG Interpretation   Date/Time:  Saturday Mar 05 2014 01:56:32 EDT Ventricular Rate:  173 PR Interval:    QRS Duration: 81 QT Interval:  288 QTC Calculation: 489 R Axis:   18 Text Interpretation:  Atrial fibrillation Borderline prolonged QT interval  Baseline wander in lead(s) V2 Confirmed by Ranae Palms  MD, Edsel Shives ([id number]) on  03/05/2014 2:27:15 AM      MDM   Final diagnoses:  None   Discuss with Dr. Zachery Conch. Recommended titrated diltiazem drip and admitting to medicine.    Patient's heart rate improved on Cardizem drip. Pulse now  in the 110's to 120s. Discussed with [facility] and will admit to step down bed.  Loren Raceravid Bardia Wangerin, MD 03/05/14 [phone]

## 2014-03-06 DIAGNOSIS — R079 Chest pain, unspecified: Secondary | ICD-10-CM

## 2014-03-06 LAB — CBC
HCT: 45.9 % (ref 39.0–52.0)
HEMOGLOBIN: 15.7 g/dL (ref 13.0–17.0)
MCH: 31 pg (ref 26.0–34.0)
MCHC: 34.2 g/dL (ref 30.0–36.0)
MCV: 90.5 fL (ref 78.0–100.0)
Platelets: 208 10*3/uL (ref 150–400)
RBC: 5.07 MIL/uL (ref 4.22–5.81)
RDW: 13 % (ref 11.5–15.5)
WBC: 13.7 10*3/uL — ABNORMAL HIGH (ref 4.0–10.5)

## 2014-03-06 LAB — BASIC METABOLIC PANEL
BUN: 13 mg/dL (ref 6–23)
CO2: 23 mEq/L (ref 19–32)
Calcium: 8.8 mg/dL (ref 8.4–10.5)
Chloride: 102 mEq/L (ref 96–112)
Creatinine, Ser: 0.83 mg/dL (ref 0.50–1.35)
Glucose, Bld: 158 mg/dL — ABNORMAL HIGH (ref 70–99)
POTASSIUM: 4.6 meq/L (ref 3.7–5.3)
SODIUM: 138 meq/L (ref 137–147)

## 2014-03-06 LAB — HEPARIN LEVEL (UNFRACTIONATED)
HEPARIN UNFRACTIONATED: 0.65 [IU]/mL (ref 0.30–0.70)
Heparin Unfractionated: 0.8 IU/mL — ABNORMAL HIGH (ref 0.30–0.70)

## 2014-03-06 LAB — GLUCOSE, CAPILLARY
GLUCOSE-CAPILLARY: 146 mg/dL — AB (ref 70–99)
GLUCOSE-CAPILLARY: 145 mg/dL — AB (ref 70–99)
Glucose-Capillary: 138 mg/dL — ABNORMAL HIGH (ref 70–99)
Glucose-Capillary: 129 mg/dL — ABNORMAL HIGH (ref 70–99)
Glucose-Capillary: 192 mg/dL — ABNORMAL HIGH (ref 70–99)

## 2014-03-06 MED ORDER — APIXABAN 5 MG PO TABS
5.0000 mg | ORAL_TABLET | Freq: Two times a day (BID) | ORAL | Status: DC
  Administered 2014-03-06 – 2014-03-07 (×3): 5 mg via ORAL
  Filled 2014-03-06 (×5): qty 1

## 2014-03-06 MED ORDER — DILTIAZEM HCL ER COATED BEADS 180 MG PO CP24
180.0000 mg | ORAL_CAPSULE | Freq: Every day | ORAL | Status: DC
  Administered 2014-03-06 – 2014-03-07 (×2): 180 mg via ORAL
  Filled 2014-03-06 (×2): qty 1

## 2014-03-06 MED ORDER — METFORMIN HCL 500 MG PO TABS
1000.0000 mg | ORAL_TABLET | Freq: Two times a day (BID) | ORAL | Status: DC
  Administered 2014-03-06 – 2014-03-07 (×4): 1000 mg via ORAL
  Filled 2014-03-06 (×8): qty 2

## 2014-03-06 NOTE — Progress Notes (Signed)
[facility] PROGRESS NOTE   Paul Ford XAJ:[id number] DOB: 08-19-1976 DOA: 03/05/2014 PCP: No primary provider on file.  HPI/Subjective: Feels better, denies any chest pain or palpitations.  Assessment/Plan: Principal Problem:   Atrial fibrillation with RVR Active Problems:   New onset atrial fibrillation   DM2 (diabetes mellitus, type 2)    Atrial fibrillation with RVR -Appreciate Dr. Annitta Ford help, patient is on Cardizem drip and amiodarone orally. -Started on heparin. -Chads 2 score of 2 secondary to diabetes and hypertension. -Heart rate is controlled with current medication, we'll leave switching to oral Cardizem to cardiology discretion.  Diabetes mellitus type 2 -New diagnosis, patient said he was being borderline, hemoglobin A1c of 7.2. -Started on metformin 1 g twice a day. -Continue carbohydrate modified diet and insulin sliding scale.  Probable sleep apnea -Patient need outpatient sleep study.  Code Status: Full code Family Communication: Plan discussed with the patient. Disposition Plan: Remains inpatient   Consultants:  None  Procedures:  None  Antibiotics:  None   Objective: Filed Vitals:   03/06/14 0800  BP: 156/96  Pulse: 85  Temp: 98.4 F (36.9 C)  Resp: 15    Intake/Output Summary (Last 24 hours) at 03/06/14 1017 Last data filed at 03/06/14 0800  Gross per 24 hour  Intake    467 ml  Output   2910 ml  Net  -2443 ml   Filed Weights   03/05/14 0515 03/06/14 0400  Weight: 146.8 kg (323 lb 10.2 oz) 146.5 kg (322 lb 15.6 oz)    Exam: General: Alert and awake, oriented x3, not in any acute distress. HEENT: anicteric sclera, pupils reactive to light and accommodation, EOMI CVS: S1-S2 clear, no murmur rubs or gallops Chest: clear to auscultation bilaterally, no wheezing, rales or rhonchi Abdomen: soft nontender, nondistended, normal bowel sounds, no organomegaly Extremities: no cyanosis, clubbing or edema noted  bilaterally Neuro: Cranial nerves II-XII intact, no focal neurological deficits  Data Reviewed: Basic Metabolic Panel:  Recent Labs Lab 03/05/14 0210 03/06/14 0355  NA 140 138  K 5.1 4.6  CL 100 102  CO2 25 23  GLUCOSE 258* 158*  BUN 15 13  CREATININE 1.01 0.83  CALCIUM 9.5 8.8   Liver Function Tests:  Recent Labs Lab 03/05/14 0210  AST 25  ALT 24  ALKPHOS 65  BILITOT 0.3  PROT 8.0  ALBUMIN 3.8   No results found for this basename: LIPASE, AMYLASE,  in the last 168 hours No results found for this basename: AMMONIA,  in the last 168 hours CBC:  Recent Labs Lab 03/05/14 0210 03/06/14 0355  WBC 14.3* 13.7*  NEUTROABS 9.3*  --   HGB 17.0 15.7  HCT 49.3 45.9  MCV 91.6 90.5  PLT 251 208   Cardiac Enzymes:  Recent Labs Lab 03/05/14 0210 03/05/14 1250 03/05/14 1954  TROPONINI <0.30 <0.30 <0.30   BNP (last 3 results) No results found for this basename: PROBNP,  in the last 8760 hours CBG:  Recent Labs Lab 03/05/14 0718 03/05/14 1144 03/05/14 1715 03/05/14 2144 03/06/14 0742  GLUCAP 164* 144* 127* 138* 146*    Micro Recent Results (from the past 240 hour(s))  MRSA PCR SCREENING     Status: None   Collection Time    03/05/14  5:18 AM      Result Value Ref Range Status   MRSA by PCR NEGATIVE  NEGATIVE Final   Comment:            The GeneXpert MRSA Assay (FDA  approved for NASAL specimens     only), is one component of a     comprehensive MRSA colonization     surveillance program. It is not     intended to diagnose MRSA     infection nor to guide or     monitor treatment for     MRSA infections.     Studies: Dg Chest Port 1 View  03/05/2014   CLINICAL DATA:  38 year old male chest pressure shortness of Breath. Initial encounter.  EXAM: PORTABLE CHEST - 1 VIEW  COMPARISON:  None.  FINDINGS: Portable AP semi upright view at 0232 hrs. Normal cardiac size and mediastinal contours. Visualized tracheal air column is within normal limits.  Allowing for portable technique, the lungs are clear. No pneumothorax or pleural effusion.  IMPRESSION: No acute cardiopulmonary abnormality.   Electronically Signed   By: Augusto GambleLee  Ford M.D.   On: 03/05/2014 03:02    Scheduled Meds: . amiodarone  200 mg Oral BID  . aspirin  325 mg Oral Daily  . insulin aspart  0-15 Units Subcutaneous TID WC  . lisinopril  20 mg Oral Daily  . sodium chloride  3 mL Intravenous Q12H   Continuous Infusions: . diltiazem (CARDIZEM) infusion 5 mg/hr (03/06/14 0405)  . heparin 2,500 Units/hr (03/06/14 0405)       Time spent: 35 minutes    Paul Ford [facility]  [facility] Pager [phone] If 7PM-7AM, please contact night-coverage at [email or web address], password [email or web address] 03/06/2014, 10:17 AM  LOS: 1 day

## 2014-03-06 NOTE — Progress Notes (Signed)
ANTICOAGULATION CONSULT NOTE - Follow Up Consult  Pharmacy Consult for Heparin Indication: atrial fibrillation  No Known Allergies  Patient Measurements: Height: 5\' 9"  (175.3 cm) Weight: 322 lb 15.6 oz (146.5 kg) IBW/kg (Calculated) : 70.7 Heparin Dosing Weight:   Vital Signs: Temp: 97.8 F (36.6 C) (05/31 0400) Temp src: Oral (05/31 0400) BP: 137/85 mmHg (05/31 0430) Pulse Rate: 70 (05/31 0430)  Labs:  Recent Labs  03/05/14 0210 03/05/14 1250 03/05/14 1358 03/05/14 1954 03/05/14 2025 03/06/14 0355  HGB 17.0  --   --   --   --  15.7  HCT 49.3  --   --   --   --  45.9  PLT 251  --   --   --   --  208  APTT  --   --  28  --   --   --   LABPROT  --   --  13.3  --   --   --   INR  --   --  1.03  --   --   --   HEPARINUNFRC  --   --   --   --  0.23* 0.65  CREATININE 1.01  --   --   --   --  0.83  TROPONINI <0.30 <0.30  --  <0.30  --   --     Estimated Creatinine Clearance: 169 ml/min (by C-G formula based on Cr of 0.83).   Medications:  Infusions:  . diltiazem (CARDIZEM) infusion 5 mg/hr (03/06/14 0405)  . heparin 2,500 Units/hr (03/06/14 0405)    Assessment: Patient with heparin level at goal.  No issues per RN.  Goal of Therapy:  Heparin level 0.3-0.7 units/ml Monitor platelets by anticoagulation protocol: Yes   Plan:  Continue heparin drip at current rate Recheck level at 1100  [employer]. 03/06/2014,5:54 AM

## 2014-03-06 NOTE — Progress Notes (Signed)
Echocardiogram 2D Echocardiogram has been performed.  Paul Ford 03/06/2014, 9:16 AM

## 2014-03-06 NOTE — Progress Notes (Signed)
ANTICOAGULATION CONSULT NOTE - Initial Consult  Pharmacy Consult for apixiban Indication: atrial fibrillation  No Known Allergies  Patient Measurements: Height: 5\' 9"  (175.3 cm) Weight: 322 lb 15.6 oz (146.5 kg) IBW/kg (Calculated) : 70.7  Vital Signs: Temp: 98.4 F (36.9 C) (05/31 0800) Temp src: Oral (05/31 0800) BP: 134/73 mmHg (05/31 1000) Pulse Rate: 72 (05/31 1000)  Labs:  Recent Labs  03/05/14 0210 03/05/14 1250 03/05/14 1358 03/05/14 1954 03/05/14 2025 03/06/14 0355 03/06/14 1104  HGB 17.0  --   --   --   --  15.7  --   HCT 49.3  --   --   --   --  45.9  --   PLT 251  --   --   --   --  208  --   APTT  --   --  28  --   --   --   --   LABPROT  --   --  13.3  --   --   --   --   INR  --   --  1.03  --   --   --   --   HEPARINUNFRC  --   --   --   --  0.23* 0.65 0.80*  CREATININE 1.01  --   --   --   --  0.83  --   TROPONINI <0.30 <0.30  --  <0.30  --   --   --     Estimated Creatinine Clearance: 169 ml/min (by C-G formula based on Cr of 0.83).   Medical History: Past Medical History  Diagnosis Date  . Hypertension   . Diabetes mellitus without complication   . Elevated cholesterol     Medications:  Scheduled:  . amiodarone  200 mg Oral BID  . aspirin  325 mg Oral Daily  . diltiazem  180 mg Oral Daily  . insulin aspart  0-15 Units Subcutaneous TID WC  . lisinopril  20 mg Oral Daily  . metFORMIN  1,000 mg Oral BID WC  . sodium chloride  3 mL Intravenous Q12H   Infusions:    Assessment: 38 yo with Afib followed by cards on diltiazem drip and PO amiodarone. IV heparin d/c'd by cards and to change to PO apixiban. Baseline labs stable including renal function and CBC. No reported bleeding.   Goal of Therapy:  no bleeding Monitor platelets by anticoagulation protocol: Yes   Plan:  1) Apixivan 5mg  BID 2) Start right after IV heparin stopped   Hessie Knows, PharmD, BCPS Pager [phone] 03/06/2014 12:00 PM

## 2014-03-06 NOTE — Progress Notes (Signed)
Subjective:  Patient denies any chest pain or shortness of breath. Denies any palpitation. Remains in sinus rhythm.  Objective:  Vital Signs in the last 24 hours: Temp:  [97.8 F (36.6 C)-98.5 F (36.9 C)] 98.4 F (36.9 C) (05/31 0800) Pulse Rate:  [61-92] 72 (05/31 1000) Resp:  [11-29] 15 (05/31 1000) BP: (128-168)/(73-116) 134/73 mmHg (05/31 1000) SpO2:  [92 %-98 %] 94 % (05/31 1000) Weight:  [146.5 kg (322 lb 15.6 oz)] 146.5 kg (322 lb 15.6 oz) (05/31 0400)  Intake/Output from previous day: 05/30 0701 - 05/31 0700 In: 439.3 [I.V.:439.3] Out: 2825 [Urine:2825] Intake/Output from this shift: Total I/O In: 150 [I.V.:150] Out: 935 [Urine:935]  Physical Exam: Neck: no adenopathy, no carotid bruit, no JVD and supple, symmetrical, trachea midline Lungs: clear to auscultation bilaterally Heart: regular rate and rhythm, S1, S2 normal and Soft systolic murmur noted Abdomen: soft, non-tender; bowel sounds normal; no masses,  no organomegaly Extremities: extremities normal, atraumatic, no cyanosis or edema  Lab Results:  Recent Labs  03/05/14 0210 03/06/14 0355  WBC 14.3* 13.7*  HGB 17.0 15.7  PLT 251 208    Recent Labs  03/05/14 0210 03/06/14 0355  NA 140 138  K 5.1 4.6  CL 100 102  CO2 25 23  GLUCOSE 258* 158*  BUN 15 13  CREATININE 1.01 0.83    Recent Labs  03/05/14 1250 03/05/14 1954  TROPONINI <0.30 <0.30   Hepatic Function Panel  Recent Labs  03/05/14 0210  PROT 8.0  ALBUMIN 3.8  AST 25  ALT 24  ALKPHOS 65  BILITOT 0.3    Recent Labs  03/05/14 1250  CHOL 182   No results found for this basename: PROTIME,  in the last 72 hours  Imaging: Imaging results have been reviewed and Dg Chest Port 1 View  03/05/2014   CLINICAL DATA:  38 year old male chest pressure shortness of Breath. Initial encounter.  EXAM: PORTABLE CHEST - 1 VIEW  COMPARISON:  None.  FINDINGS: Portable AP semi upright view at 0232 hrs. Normal cardiac size and mediastinal  contours. Visualized tracheal air column is within normal limits. Allowing for portable technique, the lungs are clear. No pneumothorax or pleural effusion.  IMPRESSION: No acute cardiopulmonary abnormality.   Electronically Signed   By: Augusto Gamble M.D.   On: 03/05/2014 03:02    Cardiac Studies:  Assessment/Plan:  Status post A. fib with RVR  Status post atypical chest pain MI ruled out Uncontrolled hypertension  Diabetes mellitus  Morbid obesity  Hypercholesteremia  Probable obstructive sleep apnea Plan Change Cardizem to by mouth DC IV heparin and change Eliquis 5 mg twice daily Okay to discharge from cardiac point of view followup with me in one to 2 weeks  LOS: 1 day    Robynn Pane 03/06/2014, 11:49 AM

## 2014-03-07 LAB — CBC
HCT: 45.4 % (ref 39.0–52.0)
Hemoglobin: 16 g/dL (ref 13.0–17.0)
MCH: 31.4 pg (ref 26.0–34.0)
MCHC: 35.2 g/dL (ref 30.0–36.0)
MCV: 89 fL (ref 78.0–100.0)
PLATELETS: 201 10*3/uL (ref 150–400)
RBC: 5.1 MIL/uL (ref 4.22–5.81)
RDW: 12.9 % (ref 11.5–15.5)
WBC: 14.3 10*3/uL — AB (ref 4.0–10.5)

## 2014-03-07 LAB — GLUCOSE, CAPILLARY
Glucose-Capillary: 138 mg/dL — ABNORMAL HIGH (ref 70–99)
Glucose-Capillary: 159 mg/dL — ABNORMAL HIGH (ref 70–99)

## 2014-03-07 LAB — BASIC METABOLIC PANEL
BUN: 15 mg/dL (ref 6–23)
CALCIUM: 9.3 mg/dL (ref 8.4–10.5)
CO2: 22 meq/L (ref 19–32)
Chloride: 102 mEq/L (ref 96–112)
Creatinine, Ser: 0.81 mg/dL (ref 0.50–1.35)
GFR calc Af Amer: >90 mL/min (ref >90)
Glucose, Bld: 120 mg/dL — ABNORMAL HIGH (ref 70–99)
Potassium: 5.7 mEq/L — ABNORMAL HIGH (ref 3.7–5.3)
Sodium: 137 mEq/L (ref 137–147)

## 2014-03-07 MED ORDER — LIVING WELL WITH DIABETES BOOK
Freq: Once | Status: AC
  Administered 2014-03-07: 1
  Filled 2014-03-07: qty 1

## 2014-03-07 MED ORDER — AMIODARONE HCL 200 MG PO TABS
200.0000 mg | ORAL_TABLET | Freq: Two times a day (BID) | ORAL | Status: DC
Start: 2014-03-07 — End: 2014-05-11

## 2014-03-07 MED ORDER — DILTIAZEM HCL ER COATED BEADS 180 MG PO CP24: 180.0000 mg | ORAL_CAPSULE | Freq: Every day | ORAL | Status: DC

## 2014-03-07 MED ORDER — RIVAROXABAN 20 MG PO TABS: 20.0000 mg | ORAL_TABLET | Freq: Every day | ORAL | Status: DC

## 2014-03-07 MED ORDER — METFORMIN HCL 1000 MG PO TABS: 1000.0000 mg | ORAL_TABLET | Freq: Two times a day (BID) | ORAL | Status: DC

## 2014-03-07 MED ORDER — LISINOPRIL 10 MG PO TABS: 10.0000 mg | ORAL_TABLET | Freq: Every day | ORAL | Status: DC

## 2014-03-07 NOTE — Progress Notes (Signed)
Spoke with pt concerning discharge needs. Discount card given to him for Xarelto for 30 days, appointment with [facility] for June 6th At 3:00 PM given to pt.

## 2014-03-07 NOTE — Discharge Summary (Signed)
Physician Discharge Summary  Paul Ford WUJ:[id number] DOB: 01/02/1976 DOA: 03/05/2014  PCP: No primary provider on file.  Admit date: 03/05/2014 Discharge date: 03/07/2014  Time spent: 40 minutes  Recommendations for Outpatient Follow-up:  1. Followup with Dr. Sharyn Lull in 2 weeks. [facility]up with [facility] on 03/15/2014. 2. Patient needs outpatient sleep study.  Discharge Diagnoses:  Principal Problem:   Atrial fibrillation with RVR Active Problems:   New onset atrial fibrillation   DM2 (diabetes mellitus, type 2)   Discharge Condition: Stable  Diet recommendation: Heart healthy/carb modified diet  Filed Weights   03/05/14 0515 03/06/14 0400 03/06/14 1555  Weight: 146.8 kg (323 lb 10.2 oz) 146.5 kg (322 lb 15.6 oz) 149.1 kg (328 lb 11.3 oz)    History of present illness:  Paul Ford is a 38 y.o. male who presents to the ED with c/o palpitations. He states it feels "like my heart is in my throat". He also has SOB and L sided chest pressure with this. All of this onset around 4pm yesterday. No cough, fever, chills, extremity swelling or pain. He has been recently diagnosed with "borderline" diabetes, HTN, and hyperlipidemia. No recent medication changes, and currently on no meds for any of these at home.  In ED, found to be in A.Fib RVR (new onset and diagnosis), with initial rate in the 170s!  On my evaluation patient reports he feels much better with resolved symptoms after treatment with cardizem gtt and getting his rate down to the 100s-110s.   Hospital Course:   Atrial fibrillation with RVR  -Appreciate Dr. Annitta Jersey help, patient was on Cardizem drip and amiodarone orally.  -Started on heparin.  -CHADS 2 score of 2 secondary to diabetes and hypertension.  -Heart rate is controlled, Cardizem switch to oral form since yesterday. -To be discharged on oral Cardizem and diltiazem. -Xarelto started for anticoagulation, to followup with cardiology within 2 weeks. -Patient  given Xarelto card to it free for 30 days, by case management.  Diabetes mellitus type 2  -New diagnosis, patient said he used to be a borderline, hemoglobin A1c of 7.2.  -Started on metformin 1 g twice a day.  -Was carbohydrate modified diet and insulin sliding scale.   Probable sleep apnea  -Patient need outpatient sleep study.  Obesity -Patient counseled extensively about obesity and the benefits of weight loss.   Procedures:  None  Consultations:  Cardiology  Discharge Exam: Filed Vitals:   03/07/14 1440  BP: 146/80  Pulse: 84  Temp: 97.7 F (36.5 C)  Resp: 18   General: Alert and awake, oriented x3, not in any acute distress. Obese African American male HEENT: anicteric sclera, pupils reactive to light and accommodation, EOMI CVS: S1-S2 clear, no murmur rubs or gallops Chest: clear to auscultation bilaterally, no wheezing, rales or rhonchi Abdomen: soft nontender, nondistended, normal bowel sounds, no organomegaly Extremities: no cyanosis, clubbing or edema noted bilaterally Neuro: Cranial nerves II-XII intact, no focal neurological deficits   Discharge Instructions You were cared for by a hospitalist during your hospital stay. If you have any questions about your discharge medications or the care you received while you were in the hospital after you are discharged, you can call the unit and asked to speak with the hospitalist on call if the hospitalist that took care of you is not available. Once you are discharged, your primary care physician will handle any further medical issues. Please note that NO REFILLS for any discharge medications will be authorized once you are  discharged, as it is imperative that you return to your primary care physician (or establish a relationship with a primary care physician if you do not have one) for your aftercare needs so that they can reassess your need for medications and monitor your lab values.      Discharge Instructions    Diet - low sodium heart healthy    Complete by:  As directed      Diet Carb Modified    Complete by:  As directed             Medication List         acetaminophen 500 MG tablet  Commonly known as:  TYLENOL  Take 2,000 mg by mouth 2 (two) times daily.     amiodarone 200 MG tablet  Commonly known as:  PACERONE  Take 1 tablet (200 mg total) by mouth 2 (two) times daily.     diltiazem 180 MG 24 hr capsule  Commonly known as:  CARDIZEM CD  Take 1 capsule (180 mg total) by mouth daily.     lisinopril 10 MG tablet  Commonly known as:  PRINIVIL,ZESTRIL  Take 1 tablet (10 mg total) by mouth daily.     metFORMIN 1000 MG tablet  Commonly known as:  GLUCOPHAGE  Take 1 tablet (1,000 mg total) by mouth 2 (two) times daily with a meal.     naphazoline-pheniramine 0.025-0.3 % ophthalmic solution  Commonly known as:  NAPHCON-A  Place 1 drop into both eyes as needed for irritation.     rivaroxaban 20 MG Tabs tablet  Commonly known as:  XARELTO  Take 1 tablet (20 mg total) by mouth daily with supper.       No Known Allergies Follow-up Information   Follow up with Robynn Pane, MD In 2 weeks.   Specialty:  Cardiology   Contact information:   [address] [place] [address] [phone]       Follow up with Jeanann Lewandowsky, MD On 03/15/2014. (appointment at 3:00PM, [facility] 6/17 at 2:00 PM at [facility]    Specialty:  Internal Medicine   Contact information:   [address] [place] [address] [phone]        The results of significant diagnostics from this hospitalization (including imaging, microbiology, ancillary and laboratory) are listed below for reference.    Significant Diagnostic Studies: Dg Chest Port 1 View  03/05/2014   CLINICAL DATA:  38 year old male chest pressure shortness of Breath. Initial encounter.  EXAM: PORTABLE CHEST - 1 VIEW  COMPARISON:  None.  FINDINGS: Portable AP semi upright view at  0232 hrs. Normal cardiac size and mediastinal contours. Visualized tracheal air column is within normal limits. Allowing for portable technique, the lungs are clear. No pneumothorax or pleural effusion.  IMPRESSION: No acute cardiopulmonary abnormality.   Electronically Signed   By: Augusto Gamble M.D.   On: 03/05/2014 03:02    Microbiology: Recent Results (from the past 240 hour(s))  MRSA PCR SCREENING     Status: None   Collection Time    03/05/14  5:18 AM      Result Value Ref Range Status   MRSA by PCR NEGATIVE  NEGATIVE Final   Comment:            The GeneXpert MRSA Assay (FDA     approved for NASAL specimens     only), is one component of a     comprehensive MRSA  colonization     surveillance program. It is not     intended to diagnose MRSA     infection nor to guide or     monitor treatment for     MRSA infections.     Labs: Basic Metabolic Panel:  Recent Labs Lab 03/05/14 0210 03/06/14 0355 03/07/14 0410  NA 140 138 137  K 5.1 4.6 5.7*  CL 100 102 102  CO2 25 23 22   GLUCOSE 258* 158* 120*  BUN 15 13 15   CREATININE 1.01 0.83 0.81  CALCIUM 9.5 8.8 9.3   Liver Function Tests:  Recent Labs Lab 03/05/14 0210  AST 25  ALT 24  ALKPHOS 65  BILITOT 0.3  PROT 8.0  ALBUMIN 3.8   No results found for this basename: LIPASE, AMYLASE,  in the last 168 hours No results found for this basename: AMMONIA,  in the last 168 hours CBC:  Recent Labs Lab 03/05/14 0210 03/06/14 0355 03/07/14 0410  WBC 14.3* 13.7* 14.3*  NEUTROABS 9.3*  --   --   HGB 17.0 15.7 16.0  HCT 49.3 45.9 45.4  MCV 91.6 90.5 89.0  PLT 251 208 201   Cardiac Enzymes:  Recent Labs Lab 03/05/14 0210 03/05/14 1250 03/05/14 1954  TROPONINI <0.30 <0.30 <0.30   BNP: BNP (last 3 results) No results found for this basename: PROBNP,  in the last 8760 hours CBG:  Recent Labs Lab 03/06/14 1208 03/06/14 1648 03/06/14 2207 03/07/14 0801 03/07/14 1136  GLUCAP 145* 129* 192* 138* 159*        Signed:  Dyllan Kats  [facility] 03/07/2014, 2:46 PM

## 2014-03-15 ENCOUNTER — Ambulatory Visit: Payer: Self-pay | Attending: Internal Medicine | Admitting: Internal Medicine

## 2014-03-15 ENCOUNTER — Encounter: Payer: Self-pay | Admitting: Internal Medicine

## 2014-03-15 ENCOUNTER — Ambulatory Visit: Payer: Self-pay

## 2014-03-15 VITALS — BP 109/77 | HR 90 | Temp 98.6°F | Resp 14 | Ht 69.0 in | Wt 327.0 lb

## 2014-03-15 DIAGNOSIS — G4733 Obstructive sleep apnea (adult) (pediatric): Secondary | ICD-10-CM | POA: Insufficient documentation

## 2014-03-15 DIAGNOSIS — I4891 Unspecified atrial fibrillation: Secondary | ICD-10-CM

## 2014-03-15 DIAGNOSIS — E119 Type 2 diabetes mellitus without complications: Secondary | ICD-10-CM | POA: Insufficient documentation

## 2014-03-15 LAB — GLUCOSE, POCT (MANUAL RESULT ENTRY): POC GLUCOSE: 85 mg/dL (ref 70–99)

## 2014-03-15 NOTE — Progress Notes (Signed)
Patient ID: Paul Ford Smethers, male   DOB: [birth year], 38 y.o.   MRN: [id number]   Paul Ford Gilkey, is a 38 y.o. male  [id number]  [id number]  DOB - [birth year]  CC:  Chief Complaint  Patient presents with  . Hospitalization Follow-up       HPI: Paul Ford Molzahn is a 38 y.o. male here today to establish medical care. Patient has medical history of hypertension, diabetes mellitus, dyslipidemia was recently seen in the ED and admitted to the hospital for new onset atrial fibrillation. In the ED his EKG showed atrial fibrillation with rapid ventricular response with initial rate of 170. He was also advised to have sleep study done. He is here today as a hospital discharge followup. He CHADS2 score was 2 and patient was started on Xarelto, heart rate is now controlled, patient was discharged on oral Cardizem. His heart rate today is 90. Patient is obese with a weight of 327 pounds Patient has no complaint today, he feels a lot better. His last A1c was 7.2%, his blood pressure is reasonably controlled. He does not smoke cigarette, drinks alcohol occasionally. Patient has No headache, No chest pain, No abdominal pain - No Nausea, No new weakness tingling or numbness, No Cough - SOB.  No Known Allergies Past Medical History  Diagnosis Date  . Hypertension   . Diabetes mellitus without complication   . Elevated cholesterol    Current Outpatient Prescriptions on File Prior to Visit  Medication Sig Dispense Refill  . acetaminophen (TYLENOL) 500 MG tablet Take 2,000 mg by mouth 2 (two) times daily.      Marland Kitchen. amiodarone (PACERONE) 200 MG tablet Take 1 tablet (200 mg total) by mouth 2 (two) times daily.  60 tablet  0  . diltiazem (CARDIZEM CD) 180 MG 24 hr capsule Take 1 capsule (180 mg total) by mouth daily.  30 capsule  0  . lisinopril (PRINIVIL,ZESTRIL) 10 MG tablet Take 1 tablet (10 mg total) by mouth daily.  30 tablet  0  . metFORMIN (GLUCOPHAGE) 1000 MG tablet Take 1 tablet (1,000 mg total) by mouth 2 (two)  times daily with a meal.  60 tablet  0  . rivaroxaban (XARELTO) 20 MG TABS tablet Take 1 tablet (20 mg total) by mouth daily with supper.  30 tablet  0   No current facility-administered medications on file prior to visit.   Family History  Problem Relation Age of Onset  . Adopted: Yes   History   Social History  . Marital Status: Married    Spouse Name: N/A    Number of Children: N/A  . Years of Education: N/A   Occupational History  . Not on file.   Social History Main Topics  . Smoking status: Never Smoker   . Smokeless tobacco: Never Used  . Alcohol Use: Yes     Comment: rare to occassional  . Drug Use: No  . Sexual Activity: Not on file   Other Topics Concern  . Not on file   Social History Narrative  . No narrative on file    Review of Systems: Constitutional: Negative for fever, chills, diaphoresis, activity change, appetite change and fatigue. HENT: Negative for ear pain, nosebleeds, congestion, facial swelling, rhinorrhea, neck pain, neck stiffness and ear discharge.  Eyes: Negative for pain, discharge, redness, itching and visual disturbance. Respiratory: Negative for cough, choking, chest tightness, shortness of breath, wheezing and stridor.  Cardiovascular: Negative for chest pain, palpitations and leg swelling. Gastrointestinal: Negative for abdominal distention.  Genitourinary: Negative for dysuria, urgency, frequency, hematuria, flank pain, decreased urine volume, difficulty urinating and dyspareunia.  Musculoskeletal: Negative for back pain, joint swelling, arthralgia and gait problem. Neurological: Negative for dizziness, tremors, seizures, syncope, facial asymmetry, speech difficulty, weakness, light-headedness, numbness and headaches.  Hematological: Negative for adenopathy. Does not bruise/bleed easily. Psychiatric/Behavioral: Negative for hallucinations, behavioral problems, confusion, dysphoric mood, decreased concentration and agitation.     Objective:   Filed Vitals:   03/15/14 1537  BP: 109/77  Pulse: 90  Temp: 98.6 F (37 C)  Resp: 14    Physical Exam: Constitutional: Patient appears well-developed and well-nourished. No distress. Morbidly obese HENT: Normocephalic, atraumatic, External right and left ear normal. Oropharynx is clear and moist.  Eyes: Conjunctivae and EOM are normal. PERRLA, no scleral icterus. Neck: Normal ROM. Neck supple. No JVD. No tracheal deviation. No thyromegaly. CVS: RRR, S1/S2 +, no murmurs, no gallops, no carotid bruit.  Pulmonary: Effort and breath sounds normal, no stridor, rhonchi, wheezes, rales.  Abdominal: Soft. BS +, no distension, tenderness, rebound or guarding.  Musculoskeletal: Normal range of motion. No edema and no tenderness.  Lymphadenopathy: No lymphadenopathy noted, cervical, inguinal or axillary Neuro: Alert. Normal reflexes, muscle tone coordination. No cranial nerve deficit. Skin: Skin is warm and dry. No rash noted. Not diaphoretic. No erythema. No pallor. Psychiatric: Normal mood and affect. Behavior, judgment, thought content normal.  Lab Results  Component Value Date   WBC 14.3* 03/07/2014   HGB 16.0 03/07/2014   HCT 45.4 03/07/2014   MCV 89.0 03/07/2014   PLT 201 03/07/2014   Lab Results  Component Value Date   CREATININE 0.81 03/07/2014   BUN 15 03/07/2014   NA 137 03/07/2014   K 5.7* 03/07/2014   CL 102 03/07/2014   CO2 22 03/07/2014    Lab Results  Component Value Date   HGBA1C 7.2* 03/05/2014   Lipid Panel     Component Value Date/Time   CHOL 182 03/05/2014 1250   TRIG 84 03/05/2014 1250   HDL 40 03/05/2014 1250   CHOLHDL 4.6 03/05/2014 1250   VLDL 17 03/05/2014 1250   LDLCALC 125* 03/05/2014 1250       Assessment and plan:   1. Diabetes  - Glucose (CBG) Continue metformin Low carbohydrate low sugar diet DASH diet  2. New onset a-fib Continue Cardizem, amiodarone and Xarelto  3. OSA (obstructive sleep apnea)  - Split night study;  Future  Patient was extensively counseled on nutrition and exercise.  Return in about 3 months (around 06/15/2014), or if symptoms worsen or fail to improve, for Atrial Fibrillation.  The patient was given clear instructions to go to ER or return to medical center if symptoms don't improve, worsen or new problems develop. The patient verbalized understanding. The patient was told to call to get lab results if they haven't heard anything in the next week.     This note has been created with Education officer, environmental. Any transcriptional errors are unintentional.    Jeanann Lewandowsky, MD, MHA, [facility] [place], [place] [phone]   03/15/2014, 4:18 PM

## 2014-03-15 NOTE — Patient Instructions (Addendum)
Atrial Fibrillation Atrial fibrillation is a type of irregular heart rhythm (arrhythmia). During atrial fibrillation, the upper chambers of the heart (atria) quiver continuously in a chaotic pattern. This causes an irregular and often rapid heart rate.  Atrial fibrillation is the result of the heart becoming overloaded with disorganized signals that tell it to beat. These signals are normally released one at a time by a part of the right atrium called the sinoatrial node. They then travel from the atria to the lower chambers of the heart (ventricles), causing the atria and ventricles to contract and pump blood as they pass. In atrial fibrillation, parts of the atria outside of the sinoatrial node also release these signals. This results in two problems. First, the atria receive so many signals that they do not have time to fully contract. Second, the ventricles, which can only receive one signal at a time, beat irregularly and out of rhythm with the atria.  There are three types of atrial fibrillation:   Paroxysmal Paroxysmal atrial fibrillation starts suddenly and stops on its own within a week.   Persistent Persistent atrial fibrillation lasts for more than a week. It may stop on its own or with treatment.   Permanent Permanent atrial fibrillation does not go away. Episodes of atrial fibrillation may lead to permanent atrial fibrillation.  Atrial fibrillation can prevent your heart from pumping blood normally. It increases your risk of stroke and can lead to heart failure.  CAUSES   Heart conditions, including a heart attack, heart failure, coronary artery disease, and heart valve conditions.   Inflammation of the sac that surrounds the heart (pericarditis).   Blockage of an artery in the lungs (pulmonary embolism).   Pneumonia or other infections.   Chronic lung disease.   Thyroid problems, especially if the thyroid is overactive (hyperthyroidism).   Caffeine, excessive alcohol  use, and use of some illegal drugs.   Use of some medications, including certain decongestants and diet pills.   Heart surgery.   Birth defects.  Sometimes, no cause can be found. When this happens, the atrial fibrillation is called lone atrial fibrillation. The risk of complications from atrial fibrillation increases if you have lone atrial fibrillation and you are age 40 years or older. RISK FACTORS  Heart failure.  Coronary artery disease  Diabetes mellitus.   High blood pressure (hypertension).   Obesity.   Other arrhythmias.   Increased age. SYMPTOMS   A feeling that your heart is beating rapidly or irregularly.   A feeling of discomfort or pain in your chest.   Shortness of breath.   Sudden lightheadedness or weakness.   Getting tired easily when exercising.   Urinating more often than normal (mainly when atrial fibrillation first begins).  In paroxysmal atrial fibrillation, symptoms may start and suddenly stop. DIAGNOSIS  Your caregiver may be able to detect atrial fibrillation when taking your pulse. Usually, testing is needed to diagnosis atrial fibrillation. Tests may include:   Electrocardiography. During this test, the electrical impulses of your heart are recorded while you are lying down.   Echocardiography. During echocardiography, sound waves are used to evaluate how blood flows through your heart.   Stress test. There is more than one type of stress test. If a stress test is needed, ask your caregiver about which type is best for you.   Chest X-ray exam.   Blood tests.   Computed tomography (CT).  TREATMENT   Treating any underlying conditions. For example, if you have an overactive  thyroid, treating the condition may correct atrial fibrillation.   Medication. Medications may be given to control a rapid heart rate or to prevent blood clots, heart failure, or a stroke.   Procedure to correct the rhythm of the  heart:  Electrical cardioversion. During electrical cardioversion, a controlled, low-energy shock is delivered to the heart through your skin. If you have chest pain, very low pressure blood pressure, or sudden heart failure, this procedure may need to be done as an emergency.  Catheter ablation. During this procedure, heart tissues that send the signals that cause atrial fibrillation are destroyed.  Maze or minimaze procedure. During this surgery, thin lines of heart tissue that carry the abnormal signals are destroyed. The maze procedure is an open-heart surgery. The minimaze procedure is a minimally invasive surgery. This means that small cuts are made to access the heart instead of a large opening.  Pulmonary venous isolation. During this surgery, tissue around the veins that carry blood from the lungs (pulmonary veins) is destroyed. This tissue is thought to carry the abnormal signals. HOME CARE INSTRUCTIONS   Take medications as directed by your caregiver.  Only take medications that your caregiver approves. Some medications can make atrial fibrillation worse or recur.  If blood thinners were prescribed by your caregiver, take them exactly as directed. Too much can cause bleeding. Too little and you will not have the needed protection against stroke and other problems.  Perform blood tests at home if directed by your caregiver.  Perform blood tests exactly as directed.   Quit smoking if you smoke.   Do not drink alcohol.   Do not drink caffeinated beverages such as coffee, soda, and some teas. You may drink decaffeinated coffee, soda, or tea.   Maintain a healthy weight. Do not use diet pills unless your caregiver approves. They may make heart problems worse.   Follow diet instructions as directed by your caregiver.   Exercise regularly as directed by your caregiver.   Keep all follow-up appointments. PREVENTION  The following substances can cause atrial fibrillation  to recur:   Caffeinated beverages.   Alcohol.   Certain medications, especially those used for breathing problems.   Certain herbs and herbal medications, such as those containing ephedra or ginseng.  Illegal drugs such as cocaine and amphetamines. Sometimes medications are given to prevent atrial fibrillation from recurring. Proper treatment of any underlying condition is also important in helping prevent recurrence.  SEEK MEDICAL CARE IF:  You notice a change in the rate, rhythm, or strength of your heartbeat.   You suddenly begin urinating more frequently.   You tire more easily when exerting yourself or exercising.  SEEK IMMEDIATE MEDICAL CARE IF:   You develop chest pain, abdominal pain, sweating, or weakness.  You feel sick to your stomach (nauseous).  You develop shortness of breath.  You suddenly develop swollen feet and ankles.  You feel dizzy.  You face or limbs feel numb or weak.  There is a change in your vision or speech. MAKE SURE YOU:   Understand these instructions.  Will watch your condition.  Will get help right away if you are not doing well or get worse. Document Released: 09/23/2005 Document Revised: 01/18/2013 Document Reviewed: 11/03/2012 [facility] Patient Information 2014 [place], [place]. Diabetes Meal Planning Guide The diabetes meal planning guide is a tool to help you plan your meals and snacks. It is important for people with diabetes to manage their blood glucose (sugar) levels. Choosing the right foods  and the right amounts throughout your day will help control your blood glucose. Eating right can even help you improve your blood pressure and reach or maintain a healthy weight. CARBOHYDRATE COUNTING MADE EASY When you eat carbohydrates, they turn to sugar. This raises your blood glucose level. Counting carbohydrates can help you control this level so you feel better. When you plan your meals by counting carbohydrates, you can have more  flexibility in what you eat and balance your medicine with your food intake. Carbohydrate counting simply means adding up the total amount of carbohydrate grams in your meals and snacks. Try to eat about the same amount at each meal. Foods with carbohydrates are listed below. Each portion below is 1 carbohydrate serving or 15 grams of carbohydrates. Ask your dietician how many grams of carbohydrates you should eat at each meal or snack. Grains and Starches  1 slice bread.   English muffin or hotdog/hamburger bun.   cup cold cereal (unsweetened).   cup cooked pasta or rice.   cup starchy vegetables (corn, potatoes, peas, beans, winter squash).  1 tortilla (6 inches).   bagel.  1 waffle or pancake (size of a CD).   cup cooked cereal.  4 to 6 small crackers. *Whole grain is recommended. Fruit  1 cup fresh unsweetened berries, melon, papaya, pineapple.  1 small fresh fruit.   banana or mango.   cup fruit juice (4 oz unsweetened).   cup canned fruit in natural juice or water.  2 tbs dried fruit.  12 to 15 grapes or cherries. Milk and Yogurt  1 cup fat-free or 1% milk.  1 cup soy milk.  6 oz light yogurt with sugar-free sweetener.  6 oz low-fat soy yogurt.  6 oz plain yogurt. Vegetables  1 cup raw or  cup cooked is counted as 0 carbohydrates or a "free" food.  If you eat 3 or more servings at 1 meal, count them as 1 carbohydrate serving. Other Carbohydrates   oz chips or pretzels.   cup ice cream or frozen yogurt.   cup sherbet or sorbet.  2 inch square cake, no frosting.  1 tbs honey, sugar, jam, jelly, or syrup.  2 small cookies.  3 squares of graham crackers.  3 cups popcorn.  6 crackers.  1 cup broth-based soup.  Count 1 cup casserole or other mixed foods as 2 carbohydrate servings.  Foods with less than 20 calories in a serving may be counted as 0 carbohydrates or a "free" food. You may want to purchase a book or computer  software that lists the carbohydrate gram counts of different foods. In addition, the nutrition facts panel on the labels of the foods you eat are a good source of this information. The label will tell you how big the serving size is and the total number of carbohydrate grams you will be eating per serving. Divide this number by 15 to obtain the number of carbohydrate servings in a portion. Remember, 1 carbohydrate serving equals 15 grams of carbohydrate. SERVING SIZES Measuring foods and serving sizes helps you make sure you are getting the right amount of food. The list below tells how big or small some common serving sizes are.  1 oz.........4 stacked dice.  3 oz........Marland KitchenDeck of cards.  1 tsp.......Marland KitchenTip of little finger.  1 tbs......Marland KitchenMarland KitchenThumb.  2 tbs.......Marland KitchenGolf ball.   cup......Marland KitchenHalf of a fist.  1 cup.......Marland KitchenA fist. SAMPLE DIABETES MEAL PLAN Below is a sample meal plan that includes foods from the grain and  starches, dairy, vegetable, fruit, and meat groups. A dietician can individualize a meal plan to fit your calorie needs and tell you the number of servings needed from each food group. However, controlling the total amount of carbohydrates in your meal or snack is more important than making sure you include all of the food groups at every meal. You may interchange carbohydrate containing foods (dairy, starches, and fruits). The meal plan below is an example of a 2000 calorie diet using carbohydrate counting. This meal plan has 17 carbohydrate servings. Breakfast  1 cup oatmeal (2 carb servings).   cup light yogurt (1 carb serving).  1 cup blueberries (1 carb serving).   cup almonds. Snack  1 large apple (2 carb servings).  1 low-fat string cheese stick. Lunch  Chicken breast salad.  1 cup spinach.   cup chopped tomatoes.  2 oz chicken breast, sliced.  2 tbs low-fat [place] dressing.  12 whole-wheat crackers (2 carb servings).  12 to 15 grapes (1 carb  serving).  1 cup low-fat milk (1 carb serving). Snack  1 cup carrots.   cup hummus (1 carb serving). Dinner  3 oz broiled salmon.  1 cup brown rice (3 carb servings). Snack  1  cups steamed broccoli (1 carb serving) drizzled with 1 tsp olive oil and lemon juice.  1 cup light pudding (2 carb servings). DIABETES MEAL PLANNING WORKSHEET Your dietician can use this worksheet to help you decide how many servings of foods and what types of foods are right for you.  BREAKFAST Food Group and Servings / Carb Servings Grain/Starches __________________________________ Dairy __________________________________________ Vegetable ______________________________________ Fruit ___________________________________________ Meat __________________________________________ Fat ____________________________________________ LUNCH Food Group and Servings / Carb Servings Grain/Starches ___________________________________ Dairy ___________________________________________ Fruit ____________________________________________ Meat ___________________________________________ Fat _____________________________________________ [place] Food Group and Servings / Carb Servings Grain/Starches ___________________________________ Dairy ___________________________________________ Fruit ____________________________________________ Meat ___________________________________________ Fat _____________________________________________ SNACKS Food Group and Servings / Carb Servings Grain/Starches ___________________________________ Dairy ___________________________________________ Vegetable _______________________________________ Fruit ____________________________________________ Meat ___________________________________________ Fat _____________________________________________ DAILY TOTALS Starches _________________________ Vegetable ________________________ Fruit ____________________________ Dairy  ____________________________ Meat ____________________________ Fat ______________________________ Document Released: 06/20/2005 Document Revised: 12/16/2011 Document Reviewed: 05/01/2009 [employer] Patient Information 2014 [place]. Diabetes and Exercise Exercising regularly is important. It is not just about losing weight. It has many health benefits, such as:  Improving your overall fitness, flexibility, and endurance.  Increasing your bone density.  Helping with weight control.  Decreasing your body fat.  Increasing your muscle strength.  Reducing stress and tension.  Improving your overall health. People with diabetes who exercise gain additional benefits because exercise:  Reduces appetite.  Improves the body's use of blood sugar (glucose).  Helps lower or control blood glucose.  Decreases blood pressure.  Helps control blood lipids (such as cholesterol and triglycerides).  Improves the body's use of the hormone insulin by:  Increasing the body's insulin sensitivity.  Reducing the body's insulin needs.  Decreases the risk for heart disease because exercising:  Lowers cholesterol and triglycerides levels.  Increases the levels of good cholesterol (such as high-density lipoproteins [HDL]) in the body.  Lowers blood glucose levels. YOUR ACTIVITY PLAN  Choose an activity that you enjoy and set realistic goals. Your health care provider or diabetes educator can help you make an activity plan that works for you. You can break activities into 2 or 3 sessions throughout the day. Doing so is as good as one long session. Exercise ideas include:  Taking the dog for a walk.  Taking the stairs instead of the elevator.  Dancing to your favorite song.  Doing your favorite exercise with a friend. RECOMMENDATIONS FOR EXERCISING WITH TYPE 1 OR TYPE 2 DIABETES   Check your blood glucose before exercising. If blood glucose levels are greater than 240 mg/dL, check for  urine ketones. Do not exercise if ketones are present.  Avoid injecting insulin into areas of the body that are going to be exercised. For example, avoid injecting insulin into:  The arms when playing tennis.  The legs when jogging.  Keep a record of:  Food intake before and after you exercise.  Expected peak times of insulin action.  Blood glucose levels before and after you exercise.  The type and amount of exercise you have done.  Review your records with your health care provider. Your health care provider will help you to develop guidelines for adjusting food intake and insulin amounts before and after exercising.  If you take insulin or oral hypoglycemic agents, watch for signs and symptoms of hypoglycemia. They include:  Dizziness.  Shaking.  Sweating.  Chills.  Confusion.  Drink plenty of water while you exercise to prevent dehydration or heat stroke. Body water is lost during exercise and must be replaced.  Talk to your health care provider before starting an exercise program to make sure it is safe for you. Remember, almost any type of activity is better than none. Document Released: 12/14/2003 Document Revised: 05/26/2013 Document Reviewed: 03/02/2013 [facility] Patient Information 2014 [place], [place]. Sleep Apnea  Sleep apnea is a sleep disorder characterized by abnormal pauses in breathing while you sleep. When your breathing pauses, the level of oxygen in your blood decreases. This causes you to move out of deep sleep and into light sleep. As a result, your quality of sleep is poor, and the system that carries your blood throughout your body (cardiovascular system) experiences stress. If sleep apnea remains untreated, the following conditions can develop:  High blood pressure (hypertension).  Coronary artery disease.  Inability to achieve or maintain an erection (impotence).  Impairment of your thought process (cognitive dysfunction). There are three types  of sleep apnea: 1. Obstructive sleep apnea Pauses in breathing during sleep because of a blocked airway. 2. Central sleep apnea Pauses in breathing during sleep because the area of the brain that controls your breathing does not send the correct signals to the muscles that control breathing. 3. Mixed sleep apnea A combination of both obstructive and central sleep apnea. RISK FACTORS The following risk factors can increase your risk of developing sleep apnea:  Being overweight.  Smoking.  Having narrow passages in your nose and throat.  Being of older age.  Being male.  Alcohol use.  Sedative and tranquilizer use.  Ethnicity. Among individuals younger than 35 years, African Americans are at increased risk of sleep apnea. SYMPTOMS   Difficulty staying asleep.  Daytime sleepiness and fatigue.  Loss of energy.  Irritability.  Loud, heavy snoring.  Morning headaches.  Trouble concentrating.  Forgetfulness.  Decreased interest in sex. DIAGNOSIS  In order to diagnose sleep apnea, your caregiver will perform a physical examination. Your caregiver may suggest that you take a home sleep test. Your caregiver may also recommend that you spend the night in a sleep lab. In the sleep lab, several monitors record information about your heart, lungs, and brain while you sleep. Your leg and arm movements and blood oxygen level are also recorded. TREATMENT The following actions may help to resolve mild sleep apnea:  Sleeping on your side.  Using a decongestant if you have nasal congestion.   Avoiding the use of depressants, including alcohol, sedatives, and narcotics.   Losing weight and modifying your diet if you are overweight. There also are devices and treatments to help open your airway:  Oral appliances. These are custom-made mouthpieces that shift your lower jaw forward and slightly open your bite. This opens your airway.  Devices that create positive airway pressure.  This positive pressure "splints" your airway open to help you breathe better during sleep. The following devices create positive airway pressure:  Continuous positive airway pressure (CPAP) device. The CPAP device creates a continuous level of air pressure with an air pump. The air is delivered to your airway through a mask while you sleep. This continuous pressure keeps your airway open.  Nasal expiratory positive airway pressure (EPAP) device. The EPAP device creates positive air pressure as you exhale. The device consists of single-use valves, which are inserted into each nostril and held in place by adhesive. The valves create very little resistance when you inhale but create much more resistance when you exhale. That increased resistance creates the positive airway pressure. This positive pressure while you exhale keeps your airway open, making it easier to breath when you inhale again.  Bilevel positive airway pressure (BPAP) device. The BPAP device is used mainly in patients with central sleep apnea. This device is similar to the CPAP device because it also uses an air pump to deliver continuous air pressure through a mask. However, with the BPAP machine, the pressure is set at two different levels. The pressure when you exhale is lower than the pressure when you inhale.  Surgery. Typically, surgery is only done if you cannot comply with less invasive treatments or if the less invasive treatments do not improve your condition. Surgery involves removing excess tissue in your airway to create a wider passage way. Document Released: 09/13/2002 Document Revised: 01/18/2013 Document Reviewed: 01/30/2012 [facility] Patient Information 2014 [place], [place].

## 2014-03-15 NOTE — Progress Notes (Signed)
HFU Pt went to the ED with afib. With medication pt's heart rate went back to normal.

## 2014-03-16 ENCOUNTER — Other Ambulatory Visit: Payer: Self-pay | Admitting: Internal Medicine

## 2014-03-16 MED ORDER — RIVAROXABAN 20 MG PO TABS: 20.0000 mg | ORAL_TABLET | Freq: Every day | ORAL | Status: AC

## 2014-03-23 ENCOUNTER — Ambulatory Visit: Payer: BC Managed Care – PPO

## 2014-04-18 ENCOUNTER — Ambulatory Visit: Payer: BC Managed Care – PPO | Attending: Internal Medicine

## 2014-04-27 ENCOUNTER — Encounter (HOSPITAL_BASED_OUTPATIENT_CLINIC_OR_DEPARTMENT_OTHER): Payer: BC Managed Care – PPO

## 2014-04-28 ENCOUNTER — Ambulatory Visit (HOSPITAL_BASED_OUTPATIENT_CLINIC_OR_DEPARTMENT_OTHER): Payer: Self-pay | Attending: Internal Medicine | Admitting: Radiology

## 2014-04-28 VITALS — Ht 69.0 in | Wt 330.0 lb

## 2014-04-28 DIAGNOSIS — R0989 Other specified symptoms and signs involving the circulatory and respiratory systems: Secondary | ICD-10-CM | POA: Insufficient documentation

## 2014-04-28 DIAGNOSIS — R0609 Other forms of dyspnea: Secondary | ICD-10-CM | POA: Insufficient documentation

## 2014-04-28 DIAGNOSIS — G4733 Obstructive sleep apnea (adult) (pediatric): Secondary | ICD-10-CM | POA: Insufficient documentation

## 2014-04-28 DIAGNOSIS — Z9989 Dependence on other enabling machines and devices: Secondary | ICD-10-CM

## 2014-04-29 ENCOUNTER — Telehealth: Payer: Self-pay | Admitting: Internal Medicine

## 2014-04-29 NOTE — Telephone Encounter (Signed)
Pt states he will be leaving town on 05/16/14 and will be away for a few weeks. Pt wants to be sure that he will have enough meds while he's away. Pls contact pt.

## 2014-04-30 DIAGNOSIS — G4733 Obstructive sleep apnea (adult) (pediatric): Secondary | ICD-10-CM

## 2014-04-30 NOTE — Sleep Study (Signed)
   NAME: Paul Ford DATE OF BIRTH:  01/20/1976 MEDICAL RECORD NUMBER [id number]  LOCATION: [facility]  PHYSICIAN: YOUNG,CLINTON D  DATE OF STUDY: 04/28/2014  SLEEP STUDY TYPE: Nocturnal Polysomnogram               REFERRING PHYSICIAN: Jeanann LewandowskyJegede, Olugbemiga, MD  INDICATION FOR STUDY: Hypersomnia with sleep apnea  EPWORTH SLEEPINESS SCORE:   8/24 HEIGHT: 5\' 9"  (175.3 cm)  WEIGHT: 149.687 kg (330 lb)    Body mass index is 48.71 kg/(m^2).  NECK SIZE: 18 in.  MEDICATIONS: Charted for review  SLEEP ARCHITECTURE: Split study protocol. During the diagnostic phase, total sleep time 129.5 minutes with sleep efficiency 65.6%. Stage I was 7.7%, stage II 90.3%, stage III absent, REM 1.9% of total sleep time. Sleep latency 41.5 minutes, REM latency 149.5 minutes, awake after sleep onset 23.5 minutes, arousal index 41.7, bedtime medication: None   He was spontaneously awake from 1:30 AM.  RESPIRATORY DATA: Apnea hypopneas index (AHI) 35.7 per hour. 77 total events scored including one obstructive apnea and 76 hypopneas. Events were not positional. REM AHI 96 per hour. CPAP titration to 6 CWP, AHI 0 per hour. The patient was unable to maintain sleep after being awakened for CPAP placement and was awake the rest of the night. He wore a medium fullface mask.  OXYGEN DATA: Moderate snoring before CPAP with oxygen desaturation to a nadir of 78%. Mean oxygen saturation with CPAP was 93.9% on room air.  CARDIAC DATA: Sinus rhythm with occasional PAC  MOVEMENT/PARASOMNIA: No significant movement disturbance, bathroom x1  IMPRESSION/ RECOMMENDATION:   1) Severe obstructive sleep apnea/hypoxia syndrome, AHI 35.7 per hour with non-positional events. REM AHI 96 per hour. Moderate snoring with oxygen desaturation to a nadir of 78% on room air. 2) CPAP was titrated to 6 CWP, AHI 0 per hour. He was wearing a medium [employer] Simplus  fullface mask with heated humidifier. This titration may  not be complete because the patient had difficulty regaining sleep once CPAP was placed, and was awake the rest of the night. He may benefit from a sleep medication if another sleep study is requested for CPAP titration.  Signed Jetty Duhamellinton Young M.D. Waymon BudgeYOUNG,CLINTON D Diplomate, American Board of Sleep Medicine  ELECTRONICALLY SIGNED ON:  04/30/2014, 4:19 PM [facility] PH: [phone]   FX: [phone] ACCREDITED BY THE AMERICAN ACADEMY OF SLEEP MEDICINE

## 2014-05-11 ENCOUNTER — Ambulatory Visit: Payer: BC Managed Care – PPO | Admitting: Cardiology

## 2014-05-11 ENCOUNTER — Other Ambulatory Visit: Payer: Self-pay | Admitting: Internal Medicine

## 2014-05-11 MED ORDER — METFORMIN HCL 1000 MG PO TABS: 1000.0000 mg | ORAL_TABLET | Freq: Two times a day (BID) | ORAL | Status: AC

## 2014-05-11 MED ORDER — DILTIAZEM HCL ER COATED BEADS 180 MG PO CP24: 180.0000 mg | ORAL_CAPSULE | Freq: Every day | ORAL | Status: AC

## 2014-05-11 MED ORDER — LISINOPRIL 10 MG PO TABS: 10.0000 mg | ORAL_TABLET | Freq: Every day | ORAL | Status: AC

## 2014-05-11 MED ORDER — AMIODARONE HCL 200 MG PO TABS: 200.0000 mg | ORAL_TABLET | Freq: Two times a day (BID) | ORAL | Status: AC

## 2014-05-11 NOTE — Telephone Encounter (Signed)
Dr. Hyman HopesJegede prescribed rx to our pharmacy.

## 2014-06-16 ENCOUNTER — Ambulatory Visit: Payer: BC Managed Care – PPO | Admitting: Internal Medicine

## 2014-09-05 IMAGING — CR DG CHEST 1V PORT
1 series · 1 of 1 positions shown · non-contrast
Comparison: None.

CLINICAL DATA: 38-year-old male chest pressure shortness of Breath.
Initial encounter.

EXAM:
PORTABLE CHEST - 1 VIEW

[AP]
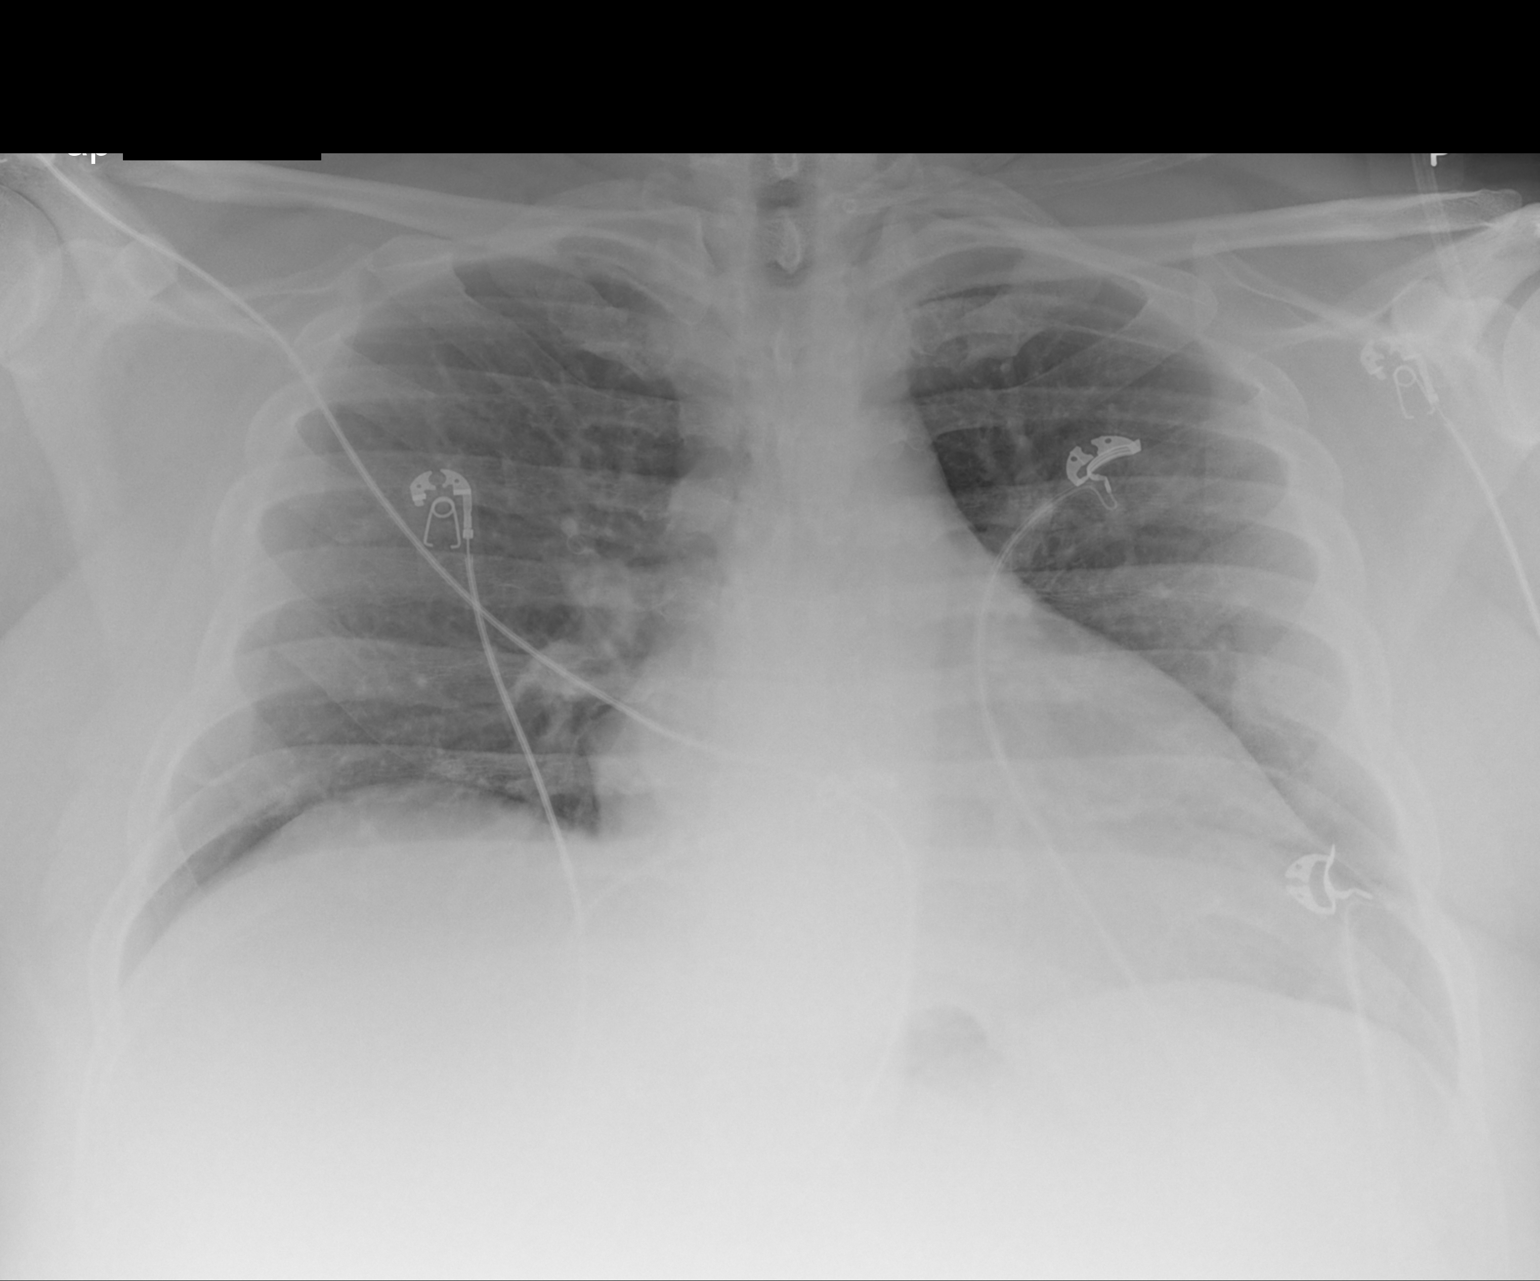

[1 of 1 positions shown; findings below may reference images not displayed]

FINDINGS: Portable AP semi upright view at 7434 hrs. Normal cardiac size and
mediastinal contours. Visualized tracheal air column is within
normal limits. Allowing for portable technique, the lungs are clear.
No pneumothorax or pleural effusion.
IMPRESSION: No acute cardiopulmonary abnormality.

## 2016-10-07 DEATH — deceased
